# Patient Record
Sex: Female | Born: 1990 | Race: Black or African American | Hispanic: No | Marital: Single | Smoking: Current every day smoker
Health system: Southern US, Community
[De-identification: ages and names within clinical notes are randomized; demographics above are authoritative.]

## PROBLEM LIST (undated history)

## (undated) ENCOUNTER — Emergency Department (HOSPITAL_COMMUNITY): Admission: EM | Payer: Medicaid Other | Source: Home / Self Care

---
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
## 2011-08-26 ENCOUNTER — Emergency Department (HOSPITAL_COMMUNITY): Payer: Medicaid Other

## 2011-08-26 ENCOUNTER — Encounter (HOSPITAL_COMMUNITY): Payer: Self-pay | Admitting: Emergency Medicine

## 2011-08-26 ENCOUNTER — Emergency Department (HOSPITAL_COMMUNITY)
Admission: EM | Admit: 2011-08-26 | Discharge: 2011-08-26 | Disposition: A | Discharge status: Home / Self Care | Payer: Medicaid Other | Attending: Emergency Medicine | Admitting: Emergency Medicine

## 2011-08-26 DIAGNOSIS — B9689 Other specified bacterial agents as the cause of diseases classified elsewhere: Secondary | ICD-10-CM | POA: Insufficient documentation

## 2011-08-26 DIAGNOSIS — A499 Bacterial infection, unspecified: Secondary | ICD-10-CM | POA: Insufficient documentation

## 2011-08-26 DIAGNOSIS — N760 Acute vaginitis: Secondary | ICD-10-CM | POA: Insufficient documentation

## 2011-08-26 DIAGNOSIS — N72 Inflammatory disease of cervix uteri: Secondary | ICD-10-CM | POA: Insufficient documentation

## 2011-08-26 DIAGNOSIS — R109 Unspecified abdominal pain: Secondary | ICD-10-CM | POA: Insufficient documentation

## 2011-08-26 LAB — PREGNANCY, URINE: Preg Test, Ur: NEGATIVE

## 2011-08-26 LAB — URINALYSIS, ROUTINE W REFLEX MICROSCOPIC
Nitrite: NEGATIVE
Specific Gravity, Urine: 1.03 (ref 1.005–1.030)
Urobilinogen, UA: 1 mg/dL (ref 0.0–1.0)

## 2011-08-26 LAB — WET PREP, GENITAL
Trich, Wet Prep: NONE SEEN
Yeast Wet Prep HPF POC: NONE SEEN

## 2011-08-26 LAB — URINE MICROSCOPIC-ADD ON

## 2011-08-26 MED ORDER — METRONIDAZOLE 500 MG PO TABS: 500.0000 mg | ORAL_TABLET | Freq: Two times a day (BID) | ORAL | Status: AC

## 2011-08-26 MED ORDER — LIDOCAINE HCL (PF) 1 % IJ SOLN
INTRAMUSCULAR | Status: AC
  Administered 2011-08-26: 2 mL
  Filled 2011-08-26: qty 5

## 2011-08-26 MED ORDER — AZITHROMYCIN 250 MG PO TABS
1000.0000 mg | ORAL_TABLET | Freq: Once | ORAL | Status: AC
  Administered 2011-08-26: 1000 mg via ORAL
  Filled 2011-08-26: qty 4

## 2011-08-26 MED ORDER — KETOROLAC TROMETHAMINE 60 MG/2ML IM SOLN
60.0000 mg | Freq: Once | INTRAMUSCULAR | Status: AC
  Administered 2011-08-26: 60 mg via INTRAMUSCULAR
  Filled 2011-08-26: qty 2

## 2011-08-26 MED ORDER — CEFTRIAXONE SODIUM 250 MG IJ SOLR
250.0000 mg | Freq: Once | INTRAMUSCULAR | Status: AC
  Administered 2011-08-26: 250 mg via INTRAMUSCULAR
  Filled 2011-08-26: qty 250

## 2011-08-26 MED ORDER — ONDANSETRON HCL 4 MG PO TABS: 4.0000 mg | ORAL_TABLET | Freq: Four times a day (QID) | ORAL | Status: AC

## 2011-08-26 NOTE — ED Provider Notes (Signed)
Medical screening examination/treatment/procedure(s) were performed by non-physician practitioner and as supervising physician I was immediately available for consultation/collaboration.  Sunnie Nielsen, MD 08/26/11 [phone]

## 2011-08-26 NOTE — ED Notes (Signed)
Patient transported to Ultrasound 

## 2011-08-26 NOTE — ED Notes (Signed)
Patient with abdominal pain/cramping for last two hours.  Patient has been nauseated, no vomiting.  Patient states she is on her menses, pain has never been this bad.

## 2011-08-26 NOTE — ED Provider Notes (Signed)
History     CSN: [id number]  Arrival date & time 08/26/11  1308   First MD Initiated Contact with Patient 08/26/11 0602      Chief Complaint  Patient presents with  . Abdominal Pain    (Consider location/radiation/quality/duration/timing/severity/associated sxs/prior treatment) HPI  21 year old female presents complaining of abdominal pain. Patient states for the past 2 days she has had sharp and achy sensation to her low abdomen associated with her menstruation. She has been on her menstruation for the same amount of time. States that this pain is very similar to the menstrual pain except much worse. The last time that she has similar pain was about a year ago. Patient also endorsed vaginal discharge for the past month. States it's not normal for her. Patient denies fever, chills, nausea, vomiting, diarrhea, dysuria, or rash. She has tried 2 ibuprofen today without relief. Patient states pain worsened with movement and improves when she lies still.  Denies pain with sexual intercourse and sts she has not engage in any sexual activities recently.  Pain worsen in the past 2 hrs.    History reviewed. No pertinent past medical history.  History reviewed. No pertinent past surgical history.  History reviewed. No pertinent family history.  History  Substance Use Topics  . Smoking status: Current Everyday Smoker    Types: Cigarettes  . Smokeless tobacco: Not on file  . Alcohol Use: No    OB History    Grav Para Term Preterm Abortions TAB SAB Ect Mult Living                  Review of Systems  All other systems reviewed and are negative.    Allergies  Review of patient's allergies indicates no known allergies.  Home Medications   Current Outpatient Rx  Name Route Sig Dispense Refill  . IBUPROFEN 200 MG PO TABS Oral Take 200 mg by mouth every 6 (six) hours as needed. For pain      BP 147/99  Temp 97.7 F (36.5 C) (Oral)  Resp 18  SpO2 100%  LMP  08/25/2011  Physical Exam  Nursing note and vitals reviewed. Constitutional: She appears well-developed and well-nourished. No distress.  HENT:  Head: Normocephalic and atraumatic.  Eyes: Conjunctivae are normal.  Neck: Normal range of motion. Neck supple.  Cardiovascular: Normal rate and regular rhythm.   Pulmonary/Chest: Effort normal and breath sounds normal. She exhibits no tenderness.  Abdominal: Soft. There is no tenderness.  Genitourinary: Uterus normal. There is no rash or lesion on the right labia. There is no rash or lesion on the left labia. Cervix exhibits no motion tenderness and no discharge. Right adnexum displays tenderness. Right adnexum displays no mass. Left adnexum displays no mass and no tenderness. There is bleeding around the vagina. No erythema or tenderness around the vagina. No vaginal discharge found.       Chaperone present  Lymphadenopathy:       Right: No inguinal adenopathy present.       Left: No inguinal adenopathy present.    ED Course  Procedures (including critical care time)  Labs Reviewed - No data to display No results found.   No diagnosis found.  Results for orders placed during the hospital encounter of 08/26/11  URINALYSIS, ROUTINE W REFLEX MICROSCOPIC      Component Value Range   Color, Urine RED (*) YELLOW   APPearance TURBID (*) CLEAR   Specific Gravity, Urine 1.030  1.005 - 1.030   pH  5.0  5.0 - 8.0   Glucose, UA NEGATIVE  NEGATIVE mg/dL   Hgb urine dipstick LARGE (*) NEGATIVE   Bilirubin Urine LARGE (*) NEGATIVE   Ketones, ur 15 (*) NEGATIVE mg/dL   Protein, ur 956 (*) NEGATIVE mg/dL   Urobilinogen, UA 1.0  0.0 - 1.0 mg/dL   Nitrite NEGATIVE  NEGATIVE   Leukocytes, UA MODERATE (*) NEGATIVE  PREGNANCY, URINE      Component Value Range   Preg Test, Ur NEGATIVE  NEGATIVE  WET PREP, GENITAL      Component Value Range   Yeast Wet Prep HPF POC NONE SEEN  NONE SEEN   Trich, Wet Prep NONE SEEN  NONE SEEN   Clue Cells Wet Prep  HPF POC ABUNDANT (*) NONE SEEN   WBC, Wet Prep HPF POC MANY (*) NONE SEEN  URINE MICROSCOPIC-ADD ON      Component Value Range   Squamous Epithelial / LPF RARE  RARE   WBC, UA 7-10  <3 WBC/hpf   RBC / HPF TOO NUMEROUS TO COUNT  <3 RBC/hpf   Bacteria, UA RARE  RARE   Urine-Other MUCOUS PRESENT     US Transvaginal Non-ob  08/26/2011  *RADIOLOGY REPORT*  Clinical Data: 21 year old with adnexal pain.  TRANSABDOMINAL AND TRANSVAGINAL ULTRASOUND OF PELVIS Technique:  Both transabdominal and transvaginal ultrasound examinations of the pelvis were performed. Transabdominal technique was performed for global imaging of the pelvis including uterus, ovaries, adnexal regions, and pelvic cul-de-sac.  It was necessary to proceed with endovaginal exam following the transabdominal exam to visualize the uterus and ovarian tissue.  Comparison:  None  Findings:  Uterus: Normal anteverted position.  Normal appearance of the uterus and it measures 6.3 x 3.4 x 3.7 cm.  Endometrium: Normal appearance and measures 0.3 cm.  Right ovary:  Normal appearance and measures 2.2 x 1.0 x 1.0 cm. Small follicles are present.  Left ovary: Normal appearance and measures 2.7 x 0.9 x 1.1 cm. There is at least one small follicle.  Other findings: Small amount of free fluid in the cul-de-sac.  IMPRESSION: Normal study. No evidence of pelvic mass or other significant abnormality.  Original Report Authenticated By: Richarda Overlie, M.D.   US Pelvis Complete  08/26/2011  *RADIOLOGY REPORT*  Clinical Data: 21 year old with adnexal pain.  TRANSABDOMINAL AND TRANSVAGINAL ULTRASOUND OF PELVIS Technique:  Both transabdominal and transvaginal ultrasound examinations of the pelvis were performed. Transabdominal technique was performed for global imaging of the pelvis including uterus, ovaries, adnexal regions, and pelvic cul-de-sac.  It was necessary to proceed with endovaginal exam following the transabdominal exam to visualize the uterus and ovarian  tissue.  Comparison:  None  Findings:  Uterus: Normal anteverted position.  Normal appearance of the uterus and it measures 6.3 x 3.4 x 3.7 cm.  Endometrium: Normal appearance and measures 0.3 cm.  Right ovary:  Normal appearance and measures 2.2 x 1.0 x 1.0 cm. Small follicles are present.  Left ovary: Normal appearance and measures 2.7 x 0.9 x 1.1 cm. There is at least one small follicle.  Other findings: Small amount of free fluid in the cul-de-sac.  IMPRESSION: Normal study. No evidence of pelvic mass or other significant abnormality.  Original Report Authenticated By: Richarda Overlie, M.D.      1.bacterial vaginosis 2. cervicitis  MDM  Low abd pain relating to menstrual cramp.  Nonsurgical abdomen.  Mild tenderness to right adnexa without mass. No CMT.   Patient is currently on her menstruation. No McBurney's  point. Low suspicion for appendicitis.  8:09 AM Wet Prep shows abundant clue cells.  Will treat for BV.  It does shows many WBC.  Will obtain pelvic US to r/o tuboovarian abscess, torsion, ovarian cyst, or uterine fibroid.    10:38 AM Pt felt much better. Pt does have R adnexal tenderness but pelvic US is unremarkable. Rocephin/zithromax given.  Cultures sent.      Fayrene Helper, PA-C 08/26/11 1038

## 2011-08-26 NOTE — ED Notes (Signed)
Pt stated that she started her menstrual cycle on yesterday. This AM she started having severe lower abdominal pain. No nausea or vomiting. Pt stated that she is not sexually active. No abnormal vaginal discharge. No odors. Will continue to monitor.

## 2011-08-26 NOTE — Discharge Instructions (Signed)
Bacterial Vaginosis Bacterial vaginosis (BV) is a vaginal infection where the normal balance of bacteria in the vagina is disrupted. The normal balance is then replaced by an overgrowth of certain bacteria. There are several different kinds of bacteria that can cause BV. BV is the most common vaginal infection in women of childbearing age. CAUSES   The cause of BV is not fully understood. BV develops when there is an increase or imbalance of harmful bacteria.   Some activities or behaviors can upset the normal balance of bacteria in the vagina and put women at increased risk including:   Having a new sex partner or multiple sex partners.   Douching.   Using an intrauterine device (IUD) for contraception.   It is not clear what role sexual activity plays in the development of BV. However, women that have never had sexual intercourse are rarely infected with BV.  Women do not get BV from toilet seats, bedding, swimming pools or from touching objects around them.  SYMPTOMS   Grey vaginal discharge.   A fish-like odor with discharge, especially after sexual intercourse.   Itching or burning of the vagina and vulva.   Burning or pain with urination.   Some women have no signs or symptoms at all.  DIAGNOSIS  Your caregiver must examine the vagina for signs of BV. Your caregiver will perform lab tests and look at the sample of vaginal fluid through a microscope. They will look for bacteria and abnormal cells (clue cells), a pH test higher than 4.5, and a positive amine test all associated with BV.  RISKS AND COMPLICATIONS   Pelvic inflammatory disease (PID).   Infections following gynecology surgery.   Developing HIV.   Developing herpes virus.  TREATMENT  Sometimes BV will clear up without treatment. However, all women with symptoms of BV should be treated to avoid complications, especially if gynecology surgery is planned. Female partners generally do not need to be treated. However,  BV may spread between female sex partners so treatment is helpful in preventing a recurrence of BV.   BV may be treated with antibiotics. The antibiotics come in either pill or vaginal cream forms. Either can be used with nonpregnant or pregnant women, but the recommended dosages differ. These antibiotics are not harmful to the baby.   BV can recur after treatment. If this happens, a second round of antibiotics will often be prescribed.   Treatment is important for pregnant women. If not treated, BV can cause a premature delivery, especially for a pregnant woman who had a premature birth in the past. All pregnant women who have symptoms of BV should be checked and treated.   For chronic reoccurrence of BV, treatment with a type of prescribed gel vaginally twice a week is helpful.  HOME CARE INSTRUCTIONS   Finish all medication as directed by your caregiver.   Do not have sex until treatment is completed.   Tell your sexual partner that you have a vaginal infection. They should see their caregiver and be treated if they have problems, such as a mild rash or itching.   Practice safe sex. Use condoms. Only have 1 sex partner.  PREVENTION  Basic prevention steps can help reduce the risk of upsetting the natural balance of bacteria in the vagina and developing BV:  Do not have sexual intercourse (be abstinent).   Do not douche.   Use all of the medicine prescribed for treatment of BV, even if the signs and symptoms go away.     Tell your sex partner if you have BV. That way, they can be treated, if needed, to prevent reoccurrence.  SEEK MEDICAL CARE IF:   Your symptoms are not improving after 3 days of treatment.   You have increased discharge, pain, or fever.  MAKE SURE YOU:   Understand these instructions.   Will watch your condition.   Will get help right away if you are not doing well or get worse.  FOR MORE INFORMATION  Division of STD Prevention (DSTDP), Centers for Disease  Control and Prevention: [employer] American Social Health Association (ASHA): www.ashastd.org  Document Released: 02/16/2005 Document Revised: 02/05/2011 Document Reviewed: 08/09/2008 [facility] Patient Information 2012 [place], [place].Cervicitis Cervicitis is a soreness and swelling (inflammation) of the cervix. Your cervix is located at the bottom of your uterus which opens up to the vagina.  CAUSES   Sexually transmitted infections (STIs).   Allergic reaction.   Medicines or birth control devices that are put in the vagina.   Injury to the cervix.   Bacterial infections.  SYMPTOMS  There may be no symptoms. If symptoms occur, they may include:  Grey, white, yellow, or bad smelling vaginal discharge.   Pain or itching of the area outside the vagina.   Painful sexual intercourse.   Lower abdominal or lower back pain, especially during intercourse.   Frequent urination.   Abnormal vaginal bleeding between periods, after sexual intercourse, or after menopause.   Pressure or a heavy feeling in the pelvis.  DIAGNOSIS  Diagnosis is made after a pelvic exam. Other tests may include:  Examination of any discharge under a microscope (wet prep).   A Pap test.  TREATMENT  Treatment will depend on the cause of cervicitis. If it is caused by an STI, both you and your partner will need to be treated. Antibiotic medicines will be given. HOME CARE INSTRUCTIONS   Do not have sexual intercourse until your caregiver says it is okay.   Do not have sexual intercourse until your partner has been treated if your cervicitis is caused by an STI.   Take your antibiotics as directed. Finish them even if you start to feel better.  SEEK IMMEDIATE MEDICAL CARE IF:   Your symptoms come back.   You have a fever.   You experience any problems that may be related to the medicine you are taking.  MAKE SURE YOU:   Understand these instructions.   Will watch your condition.   Will get  help right away if you are not doing well or get worse.  Document Released: 02/16/2005 Document Revised: 02/05/2011 Document Reviewed: 09/15/2010 [facility] Patient Information 2012 [place], [place].

## 2011-08-26 NOTE — ED Notes (Signed)
Gave report to Lori RN

## 2011-08-27 LAB — GC/CHLAMYDIA PROBE AMP, GENITAL
Chlamydia, DNA Probe: POSITIVE — AB
GC Probe Amp, Genital: NEGATIVE

## 2011-08-28 LAB — URINE CULTURE
Colony Count: >=100000
Culture  Setup Time: 201306260939

## 2011-08-28 NOTE — ED Notes (Signed)
+  Chlamydia. Patient treated with Rocephin and Zithromax. [employer] faxed. 

## 2011-08-29 NOTE — ED Notes (Signed)
Patient called back and was informed of +result. Advised to abstain from sex for 2 weeks and notify partner(s) to get treated.

## 2011-08-29 NOTE — ED Notes (Signed)
Attempted to contact patient. No answer. Left voicemail for patient to return call. 

## 2011-09-22 ENCOUNTER — Encounter (HOSPITAL_COMMUNITY): Payer: Self-pay | Admitting: *Deleted

## 2011-09-22 ENCOUNTER — Emergency Department (HOSPITAL_COMMUNITY)
Admission: EM | Admit: 2011-09-22 | Discharge: 2011-09-22 | Disposition: A | Discharge status: Home / Self Care | Payer: Medicaid Other | Attending: Emergency Medicine | Admitting: Emergency Medicine

## 2011-09-22 DIAGNOSIS — N949 Unspecified condition associated with female genital organs and menstrual cycle: Secondary | ICD-10-CM | POA: Insufficient documentation

## 2011-09-22 DIAGNOSIS — R102 Pelvic and perineal pain: Secondary | ICD-10-CM

## 2011-09-22 DIAGNOSIS — N926 Irregular menstruation, unspecified: Secondary | ICD-10-CM | POA: Insufficient documentation

## 2011-09-22 DIAGNOSIS — R110 Nausea: Secondary | ICD-10-CM | POA: Insufficient documentation

## 2011-09-22 LAB — CBC WITH DIFFERENTIAL/PLATELET
Basophils Absolute: 0 10*3/uL (ref 0.0–0.1)
Basophils Relative: 0 % (ref 0–1)
Eosinophils Absolute: 0.2 K/uL (ref 0.0–0.7)
Eosinophils Relative: 3 % (ref 0–5)
HCT: 44.3 % (ref 36.0–46.0)
Hemoglobin: 15.1 g/dL — ABNORMAL HIGH (ref 12.0–15.0)
Lymphocytes Relative: 28 % (ref 12–46)
Lymphs Abs: 2.2 K/uL (ref 0.7–4.0)
MCH: 31.2 pg (ref 26.0–34.0)
MCHC: 34.1 g/dL (ref 30.0–36.0)
MCV: 91.5 fL (ref 78.0–100.0)
Monocytes Absolute: 0.4 10*3/uL (ref 0.1–1.0)
Monocytes Relative: 5 % (ref 3–12)
Neutro Abs: 5.1 10*3/uL (ref 1.7–7.7)
Neutrophils Relative %: 64 % (ref 43–77)
Platelets: 208 K/uL (ref 150–400)
RBC: 4.84 MIL/uL (ref 3.87–5.11)
RDW: 13.2 % (ref 11.5–15.5)
WBC: 7.9 10*3/uL (ref 4.0–10.5)

## 2011-09-22 LAB — URINALYSIS, ROUTINE W REFLEX MICROSCOPIC
Bilirubin Urine: NEGATIVE
Glucose, UA: NEGATIVE mg/dL
Ketones, ur: NEGATIVE mg/dL
Leukocytes, UA: NEGATIVE
Nitrite: NEGATIVE
Protein, ur: NEGATIVE mg/dL
Specific Gravity, Urine: 1.013 (ref 1.005–1.030)
Urobilinogen, UA: 1 mg/dL (ref 0.0–1.0)
pH: 6 (ref 5.0–8.0)

## 2011-09-22 LAB — URINE MICROSCOPIC-ADD ON

## 2011-09-22 LAB — POCT I-STAT, CHEM 8
BUN: 9 mg/dL (ref 6–23)
Calcium, Ion: 1.24 mmol/L — ABNORMAL HIGH (ref 1.12–1.23)
Chloride: 107 mEq/L (ref 96–112)
Creatinine, Ser: 0.9 mg/dL (ref 0.50–1.10)
Glucose, Bld: 101 mg/dL — ABNORMAL HIGH (ref 70–99)
HCT: 47 % — ABNORMAL HIGH (ref 36.0–46.0)
Potassium: 4 mEq/L (ref 3.5–5.1)

## 2011-09-22 LAB — WET PREP, GENITAL
Trich, Wet Prep: NONE SEEN
Yeast Wet Prep HPF POC: NONE SEEN

## 2011-09-22 MED ORDER — HYDROCODONE-ACETAMINOPHEN 5-325 MG PO TABS: 1.0000 | ORAL_TABLET | ORAL | Status: AC | PRN

## 2011-09-22 NOTE — ED Notes (Signed)
Pt is here with vaginal discharge that is white with fish odor.  LMP- finished 3 days ago.  Pt has lower abd pain.  Pt has burning with urination

## 2011-09-22 NOTE — ED Provider Notes (Signed)
History     CSN: [id number]  Arrival date & time 09/22/11  1042   First MD Initiated Contact with Patient 09/22/11 [phone]      Chief Complaint  Patient presents with  . Abdominal Pain    (Consider location/radiation/quality/duration/timing/severity/associated sxs/prior treatment) Patient is a 21 y.o. female presenting with abdominal pain. The history is provided by the patient.  Abdominal Pain The primary symptoms of the illness include abdominal pain, nausea and vaginal discharge. The primary symptoms of the illness do not include fever, shortness of breath, vomiting or dysuria. The current episode started more than 2 days ago.  The vaginal discharge is not associated with dysuria.   Associated with: She continues to have lower abdominal pain, vaginal discharge x 2 weeks, and irregular menses x several months. Associated symptoms comments: She was seen and evaluated on 08-26-11 for same symptoms. .    History reviewed. No pertinent past medical history.  History reviewed. No pertinent past surgical history.  No family history on file.  History  Substance Use Topics  . Smoking status: Current Everyday Smoker    Types: Cigarettes  . Smokeless tobacco: Not on file  . Alcohol Use: No    OB History    Grav Para Term Preterm Abortions TAB SAB Ect Mult Living                  Review of Systems  Constitutional: Negative for fever.  Respiratory: Negative for shortness of breath.   Cardiovascular: Negative for chest pain.  Gastrointestinal: Positive for nausea and abdominal pain. Negative for vomiting.  Genitourinary: Positive for vaginal discharge and menstrual problem. Negative for dysuria.    Allergies  Review of patient's allergies indicates no known allergies.  Home Medications  No current outpatient prescriptions on file.  BP 103/67  Pulse 53  Temp 98.1 F (36.7 C) (Oral)  Resp 20  SpO2 99%  LMP 09/18/2011  Physical Exam  Constitutional: She appears  well-developed and well-nourished.  HENT:  Head: Normocephalic.  Neck: Neck supple.  Pulmonary/Chest: Effort normal.  Abdominal: Soft. Bowel sounds are normal. There is no tenderness. There is no rebound and no guarding.  Genitourinary:       Mild pelvic tenderness on bimanual exam. No mass. Cervix appears unremarkable without discharge on exam.   Musculoskeletal: Normal range of motion.  Neurological: She is alert.  Skin: Skin is warm and dry.  Psychiatric: She has a normal mood and affect.    ED Course  Procedures (including critical care time)  Labs Reviewed  URINALYSIS, ROUTINE W REFLEX MICROSCOPIC - Abnormal; Notable for the following:    APPearance CLOUDY (*)     Hgb urine dipstick SMALL (*)     All other components within normal limits  CBC WITH DIFFERENTIAL - Abnormal; Notable for the following:    Hemoglobin 15.1 (*)     All other components within normal limits  POCT I-STAT, CHEM 8 - Abnormal; Notable for the following:    Glucose, Bld 101 (*)     Calcium, Ion 1.24 (*)     Hemoglobin 16.0 (*)     HCT 47.0 (*)     All other components within normal limits  URINE MICROSCOPIC-ADD ON - Abnormal; Notable for the following:    Squamous Epithelial / LPF MANY (*)     Bacteria, UA FEW (*)     All other components within normal limits  WET PREP, GENITAL - Abnormal; Notable for the following:    Clue  Cells Wet Prep HPF POC FEW (*)     WBC, Wet Prep HPF POC MODERATE (*)     All other components within normal limits  POCT PREGNANCY, URINE  GC/CHLAMYDIA PROBE AMP, GENITAL   No results found.  Results for orders placed during the hospital encounter of 09/22/11  URINALYSIS, ROUTINE W REFLEX MICROSCOPIC      Component Value Range   Color, Urine YELLOW  YELLOW   APPearance CLOUDY (*) CLEAR   Specific Gravity, Urine 1.013  1.005 - 1.030   pH 6.0  5.0 - 8.0   Glucose, UA NEGATIVE  NEGATIVE mg/dL   Hgb urine dipstick SMALL (*) NEGATIVE   Bilirubin Urine NEGATIVE  NEGATIVE    Ketones, ur NEGATIVE  NEGATIVE mg/dL   Protein, ur NEGATIVE  NEGATIVE mg/dL   Urobilinogen, UA 1.0  0.0 - 1.0 mg/dL   Nitrite NEGATIVE  NEGATIVE   Leukocytes, UA NEGATIVE  NEGATIVE  CBC WITH DIFFERENTIAL      Component Value Range   WBC 7.9  4.0 - 10.5 K/uL   RBC 4.84  3.87 - 5.11 MIL/uL   Hemoglobin 15.1 (*) 12.0 - 15.0 g/dL   HCT 16.1  09.6 - 04.5 %   MCV 91.5  78.0 - 100.0 fL   MCH 31.2  26.0 - 34.0 pg   MCHC 34.1  30.0 - 36.0 g/dL   RDW 40.9  81.1 - 91.4 %   Platelets 208  150 - 400 K/uL   Neutrophils Relative 64  43 - 77 %   Neutro Abs 5.1  1.7 - 7.7 K/uL   Lymphocytes Relative 28  12 - 46 %   Lymphs Abs 2.2  0.7 - 4.0 K/uL   Monocytes Relative 5  3 - 12 %   Monocytes Absolute 0.4  0.1 - 1.0 K/uL   Eosinophils Relative 3  0 - 5 %   Eosinophils Absolute 0.2  0.0 - 0.7 K/uL   Basophils Relative 0  0 - 1 %   Basophils Absolute 0.0  0.0 - 0.1 K/uL  POCT PREGNANCY, URINE      Component Value Range   Preg Test, Ur NEGATIVE  NEGATIVE  POCT I-STAT, CHEM 8      Component Value Range   Sodium 140  135 - 145 mEq/L   Potassium 4.0  3.5 - 5.1 mEq/L   Chloride 107  96 - 112 mEq/L   BUN 9  6 - 23 mg/dL   Creatinine, Ser 7.82  0.50 - 1.10 mg/dL   Glucose, Bld 956 (*) 70 - 99 mg/dL   Calcium, Ion 2.13 (*) 1.12 - 1.23 mmol/L   TCO2 25  0 - 100 mmol/L   Hemoglobin 16.0 (*) 12.0 - 15.0 g/dL   HCT 08.6 (*) 57.8 - 46.9 %  URINE MICROSCOPIC-ADD ON      Component Value Range   Squamous Epithelial / LPF MANY (*) RARE   WBC, UA 0-2  <3 WBC/hpf   RBC / HPF 0-2  <3 RBC/hpf   Bacteria, UA FEW (*) RARE  WET PREP, GENITAL      Component Value Range   Yeast Wet Prep HPF POC NONE SEEN  NONE SEEN   Trich, Wet Prep NONE SEEN  NONE SEEN   Clue Cells Wet Prep HPF POC FEW (*) NONE SEEN   WBC, Wet Prep HPF POC MODERATE (*) NONE SEEN    No diagnosis found. 1. Irregular menses 2. Pelvic pain    MDM  She  is having persistent symptoms for weeks. Records reviewed - pelvic US and GC/Chlamydia  cultures from 6/26 negative. Will refer to [facility] and treat pain.        Rodena Medin, PA-C 09/22/11 1648

## 2011-09-23 NOTE — ED Provider Notes (Signed)
Medical screening examination/treatment/procedure(s) were performed by non-physician practitioner and as supervising physician I was immediately available for consultation/collaboration.   Morgan Morales. Oletta Lamas, MD 09/23/11 [phone]

## 2012-05-25 ENCOUNTER — Encounter (HOSPITAL_COMMUNITY): Payer: Self-pay

## 2012-05-25 ENCOUNTER — Other Ambulatory Visit (HOSPITAL_COMMUNITY)
Admission: RE | Admit: 2012-05-25 | Discharge: 2012-05-25 | Disposition: A | Discharge status: Home / Self Care | Payer: Medicaid Other | Source: Ambulatory Visit | Attending: Family Medicine | Admitting: Family Medicine

## 2012-05-25 ENCOUNTER — Emergency Department (INDEPENDENT_AMBULATORY_CARE_PROVIDER_SITE_OTHER)
Admission: EM | Admit: 2012-05-25 | Discharge: 2012-05-25 | Disposition: A | Discharge status: Home / Self Care | Payer: Medicaid Other | Source: Home / Self Care | Attending: Family Medicine | Admitting: Family Medicine

## 2012-05-25 DIAGNOSIS — N760 Acute vaginitis: Secondary | ICD-10-CM | POA: Insufficient documentation

## 2012-05-25 DIAGNOSIS — Z113 Encounter for screening for infections with a predominantly sexual mode of transmission: Secondary | ICD-10-CM | POA: Insufficient documentation

## 2012-05-25 DIAGNOSIS — N72 Inflammatory disease of cervix uteri: Secondary | ICD-10-CM

## 2012-05-25 DIAGNOSIS — N898 Other specified noninflammatory disorders of vagina: Secondary | ICD-10-CM

## 2012-05-25 LAB — POCT URINALYSIS DIP (DEVICE)
Glucose, UA: NEGATIVE mg/dL
Nitrite: NEGATIVE
Protein, ur: NEGATIVE mg/dL
Specific Gravity, Urine: 1.025 (ref 1.005–1.030)
Urobilinogen, UA: 0.2 mg/dL (ref 0.0–1.0)
pH: 6 (ref 5.0–8.0)

## 2012-05-25 MED ORDER — AZITHROMYCIN 250 MG PO TABS
1000.0000 mg | ORAL_TABLET | Freq: Once | ORAL | Status: AC
  Administered 2012-05-25: 1000 mg via ORAL

## 2012-05-25 MED ORDER — CEFTRIAXONE SODIUM 250 MG IJ SOLR
INTRAMUSCULAR | Status: AC
  Filled 2012-05-25: qty 250

## 2012-05-25 MED ORDER — LIDOCAINE HCL (PF) 1 % IJ SOLN
INTRAMUSCULAR | Status: AC
  Filled 2012-05-25: qty 5

## 2012-05-25 MED ORDER — METRONIDAZOLE 500 MG PO TABS: 500.0000 mg | ORAL_TABLET | Freq: Two times a day (BID) | ORAL | Status: DC

## 2012-05-25 MED ORDER — NAPROXEN 500 MG PO TABS: 500.0000 mg | ORAL_TABLET | Freq: Two times a day (BID) | ORAL | Status: DC

## 2012-05-25 MED ORDER — CEFTRIAXONE SODIUM 250 MG IJ SOLR
250.0000 mg | Freq: Once | INTRAMUSCULAR | Status: AC
  Administered 2012-05-25: 250 mg via INTRAMUSCULAR

## 2012-05-25 MED ORDER — AZITHROMYCIN 250 MG PO TABS
ORAL_TABLET | ORAL | Status: AC
  Filled 2012-05-25: qty 4

## 2012-05-25 NOTE — ED Provider Notes (Signed)
History     CSN: [id number]  Arrival date & time 05/25/12  1008   First MD Initiated Contact with Patient 05/25/12 1025      Chief Complaint  Patient presents with  . Vaginal Discharge    (Consider location/radiation/quality/duration/timing/severity/associated sxs/prior treatment) HPI Comments: 23 year old smoker female G0 P0 with a history of gonorrhea 2 years ago adequately treated as per patient. Here complaining of vaginal discharge for 2 weeks. Reports thick discharge with bad smell. Patient also reports she's been spotting in the last 2-3 days. She has not had full menstrual periods since she got Implanon few months ago. Reports low mentrual cramping. No fever or chills no nausea or vomiting. Her pregnancy test is negative today. Reports unprotected sex with a steady partner.   History reviewed. No pertinent past medical history.  History reviewed. No pertinent past surgical history.  No family history on file.  History  Substance Use Topics  . Smoking status: Current Every Day Smoker    Types: Cigarettes  . Smokeless tobacco: Not on file  . Alcohol Use: No    OB History   Grav Para Term Preterm Abortions TAB SAB Ect Mult Living                  Review of Systems  Constitutional: Negative for fever, chills and appetite change.  Gastrointestinal: Negative for nausea, vomiting and diarrhea.  Genitourinary: Positive for vaginal discharge and menstrual problem. Negative for dysuria, frequency and flank pain.  Skin: Negative for rash.  Neurological: Negative for dizziness and headaches.    Allergies  Review of patient's allergies indicates no known allergies.  Home Medications   Current Outpatient Rx  Name  Route  Sig  Dispense  Refill  . metroNIDAZOLE (FLAGYL) 500 MG tablet   Oral   Take 1 tablet (500 mg total) by mouth 2 (two) times daily.   14 tablet   0   . naproxen (NAPROSYN) 500 MG tablet   Oral   Take 1 tablet (500 mg total) by mouth 2 (two) times  daily with a meal.   30 tablet   0     BP 129/85  Pulse 77  Temp(Src) 97 F (36.1 C)  Resp 12  SpO2 98%  Physical Exam  Nursing note and vitals reviewed. Constitutional: She is oriented to person, place, and time. She appears well-developed and well-nourished. No distress.  HENT:  Head: Normocephalic and atraumatic.  Mouth/Throat: No oropharyngeal exudate.  Eyes: Conjunctivae are normal. No scleral icterus.  Cardiovascular: Normal heart sounds.   Pulmonary/Chest: Breath sounds normal.  Abdominal: Soft. Bowel sounds are normal. She exhibits no distension and no mass. There is no tenderness. There is no rebound and no guarding. Hernia confirmed negative in the right inguinal area and confirmed negative in the left inguinal area.  Genitourinary: Uterus normal. There is no rash on the right labia. There is no rash on the left labia. Cervix exhibits discharge and friability. Cervix exhibits no motion tenderness. Right adnexum displays no mass, no tenderness and no fullness. Left adnexum displays no mass, no tenderness and no fullness. There is bleeding around the vagina. Vaginal discharge found.  Lymphadenopathy:    She has no cervical adenopathy.       Right: No inguinal adenopathy present.       Left: No inguinal adenopathy present.  Neurological: She is alert and oriented to person, place, and time.  Skin: No rash noted. She is not diaphoretic.    ED Course  Procedures (including critical care time)  Labs Reviewed  POCT URINALYSIS DIP (DEVICE) - Abnormal; Notable for the following:    Hgb urine dipstick LARGE (*)    All other components within normal limits  POCT PREGNANCY, URINE  CERVICOVAGINAL ANCILLARY ONLY   No results found.   1. Cervicitis   2. Vaginal discharge       MDM  No history or findings suggestive of PID.treated empirically with Rocephin, azithromycin, naproxen and Flagyl.  Affirm, GC and Chlamydia pending at the time of discharge. Patient declined  HIV, RPR or any blood screening test. Supportive care and red flags that should prompt her return to medical attention discussed with patient and provided in writing.     Sharin Grave, MD 05/27/12 [phone]

## 2012-05-25 NOTE — ED Notes (Signed)
NAD at d/c ; call back number for lab issues verified at release

## 2012-05-25 NOTE — ED Notes (Addendum)
Concern for 2 week duration vaginal d/c; sexually active, Implanon for BC. States partner has not mentioned any STD concern; c/o low abdominal area pain , "7" on scale

## 2012-05-28 NOTE — ED Notes (Signed)
GC/Chlamydia neg., Affirm: Gardnerella pos., Candida and Trich neg.  Pt. adequately treated with Flagyl. Vassie Moselle 05/28/2012

## 2013-11-12 ENCOUNTER — Emergency Department (HOSPITAL_COMMUNITY): Payer: Medicaid Other

## 2013-11-12 ENCOUNTER — Emergency Department (HOSPITAL_COMMUNITY)
Admission: EM | Admit: 2013-11-12 | Discharge: 2013-11-12 | Disposition: A | Discharge status: Home / Self Care | Payer: Medicaid Other | Attending: Emergency Medicine | Admitting: Emergency Medicine

## 2013-11-12 ENCOUNTER — Encounter (HOSPITAL_COMMUNITY): Payer: Self-pay | Admitting: Emergency Medicine

## 2013-11-12 DIAGNOSIS — N644 Mastodynia: Secondary | ICD-10-CM | POA: Insufficient documentation

## 2013-11-12 DIAGNOSIS — F17200 Nicotine dependence, unspecified, uncomplicated: Secondary | ICD-10-CM | POA: Insufficient documentation

## 2013-11-12 DIAGNOSIS — R1030 Lower abdominal pain, unspecified: Secondary | ICD-10-CM

## 2013-11-12 DIAGNOSIS — A499 Bacterial infection, unspecified: Secondary | ICD-10-CM | POA: Insufficient documentation

## 2013-11-12 DIAGNOSIS — N760 Acute vaginitis: Secondary | ICD-10-CM | POA: Insufficient documentation

## 2013-11-12 DIAGNOSIS — B9689 Other specified bacterial agents as the cause of diseases classified elsewhere: Secondary | ICD-10-CM | POA: Insufficient documentation

## 2013-11-12 DIAGNOSIS — R109 Unspecified abdominal pain: Secondary | ICD-10-CM | POA: Insufficient documentation

## 2013-11-12 DIAGNOSIS — Z3202 Encounter for pregnancy test, result negative: Secondary | ICD-10-CM | POA: Insufficient documentation

## 2013-11-12 LAB — BASIC METABOLIC PANEL
Anion gap: 13 (ref 5–15)
BUN: 8 mg/dL (ref 6–23)
CHLORIDE: 103 meq/L (ref 96–112)
CO2: 23 mEq/L (ref 19–32)
Calcium: 9.1 mg/dL (ref 8.4–10.5)
Creatinine, Ser: 0.87 mg/dL (ref 0.50–1.10)
GFR calc non Af Amer: >90 mL/min (ref >90)
Glucose, Bld: 88 mg/dL (ref 70–99)
POTASSIUM: 4.5 meq/L (ref 3.7–5.3)
Sodium: 139 mEq/L (ref 137–147)

## 2013-11-12 LAB — URINALYSIS, ROUTINE W REFLEX MICROSCOPIC
Bilirubin Urine: NEGATIVE
Glucose, UA: NEGATIVE mg/dL
Ketones, ur: NEGATIVE mg/dL
LEUKOCYTES UA: NEGATIVE
NITRITE: NEGATIVE
PROTEIN: NEGATIVE mg/dL
Specific Gravity, Urine: 1.021 (ref 1.005–1.030)
UROBILINOGEN UA: 1 mg/dL (ref 0.0–1.0)
pH: 5.5 (ref 5.0–8.0)

## 2013-11-12 LAB — CBC
HEMATOCRIT: 43.2 % (ref 36.0–46.0)
HEMOGLOBIN: 14.8 g/dL (ref 12.0–15.0)
MCH: 31 pg (ref 26.0–34.0)
MCHC: 34.3 g/dL (ref 30.0–36.0)
MCV: 90.6 fL (ref 78.0–100.0)
Platelets: 193 10*3/uL (ref 150–400)
RBC: 4.77 MIL/uL (ref 3.87–5.11)
RDW: 13.1 % (ref 11.5–15.5)
WBC: 7.5 10*3/uL (ref 4.0–10.5)

## 2013-11-12 LAB — WET PREP, GENITAL
Trich, Wet Prep: NONE SEEN
Yeast Wet Prep HPF POC: NONE SEEN

## 2013-11-12 LAB — URINE MICROSCOPIC-ADD ON

## 2013-11-12 LAB — PREGNANCY, URINE: PREG TEST UR: NEGATIVE

## 2013-11-12 LAB — RPR

## 2013-11-12 MED ORDER — IOHEXOL 300 MG/ML  SOLN
100.0000 mL | Freq: Once | INTRAMUSCULAR | Status: AC | PRN
  Administered 2013-11-12: 100 mL via INTRAVENOUS

## 2013-11-12 MED ORDER — IOHEXOL 300 MG/ML  SOLN
25.0000 mL | Freq: Once | INTRAMUSCULAR | Status: AC | PRN
  Administered 2013-11-12: 25 mL via ORAL

## 2013-11-12 MED ORDER — ONDANSETRON HCL 4 MG/2ML IJ SOLN
4.0000 mg | Freq: Once | INTRAMUSCULAR | Status: AC
  Administered 2013-11-12: 4 mg via INTRAVENOUS
  Filled 2013-11-12: qty 2

## 2013-11-12 MED ORDER — MORPHINE SULFATE 4 MG/ML IJ SOLN
4.0000 mg | Freq: Once | INTRAMUSCULAR | Status: AC
  Administered 2013-11-12: 4 mg via INTRAVENOUS
  Filled 2013-11-12: qty 1

## 2013-11-12 MED ORDER — METRONIDAZOLE 500 MG PO TABS: 500.0000 mg | ORAL_TABLET | Freq: Two times a day (BID) | ORAL | Status: DC

## 2013-11-12 NOTE — ED Notes (Signed)
Pt from home for eval of lower abd pain x4 days with worsening yesterday, pt also reports brown vaginal discharge and fishy odor. States she has not had period in 2 years due to implant for [facility]. Reports same sexual partner as well. Denies any dysuria, hematuria, n/v/d. Nad noted. LBM 9/12/015

## 2013-11-12 NOTE — ED Notes (Signed)
CT informed that pt finished contrast. 

## 2013-11-12 NOTE — Discharge Instructions (Signed)
Follow up with your doctor for continued or worsening symptoms Abdominal Pain, Women Abdominal (stomach, pelvic, or belly) pain can be caused by many things. It is important to tell your doctor:  The location of the pain.  Does it come and go or is it present all the time?  Are there things that start the pain (eating certain foods, exercise)?  Are there other symptoms associated with the pain (fever, nausea, vomiting, diarrhea)? All of this is helpful to know when trying to find the cause of the pain. CAUSES   Stomach: virus or bacteria infection, or ulcer.  Intestine: appendicitis (inflamed appendix), regional ileitis (Crohn's disease), ulcerative colitis (inflamed colon), irritable bowel syndrome, diverticulitis (inflamed diverticulum of the colon), or cancer of the stomach or intestine.  Gallbladder disease or stones in the gallbladder.  Kidney disease, kidney stones, or infection.  Pancreas infection or cancer.  Fibromyalgia (pain disorder).  Diseases of the female organs:  Uterus: fibroid (non-cancerous) tumors or infection.  Fallopian tubes: infection or tubal pregnancy.  Ovary: cysts or tumors.  Pelvic adhesions (scar tissue).  Endometriosis (uterus lining tissue growing in the pelvis and on the pelvic organs).  Pelvic congestion syndrome (female organs filling up with blood just before the menstrual period).  Pain with the menstrual period.  Pain with ovulation (producing an egg).  Pain with an IUD (intrauterine device, birth control) in the uterus.  Cancer of the female organs.  Functional pain (pain not caused by a disease, may improve without treatment).  Psychological pain.  Depression. DIAGNOSIS  Your doctor will decide the seriousness of your pain by doing an examination.  Blood tests.  X-rays.  Ultrasound.  CT scan (computed tomography, special type of X-ray).  MRI (magnetic resonance imaging).  Cultures, for infection.  Barium enema  (dye inserted in the large intestine, to better view it with X-rays).  Colonoscopy (looking in intestine with a lighted tube).  Laparoscopy (minor surgery, looking in abdomen with a lighted tube).  Major abdominal exploratory surgery (looking in abdomen with a large incision). TREATMENT  The treatment will depend on the cause of the pain.   Many cases can be observed and treated at home.  Over-the-counter medicines recommended by your caregiver.  Prescription medicine.  Antibiotics, for infection.  Birth control pills, for painful periods or for ovulation pain.  Hormone treatment, for endometriosis.  Nerve blocking injections.  Physical therapy.  Antidepressants.  Counseling with a psychologist or psychiatrist.  Minor or major surgery. HOME CARE INSTRUCTIONS   Do not take laxatives, unless directed by your caregiver.  Take over-the-counter pain medicine only if ordered by your caregiver. Do not take aspirin because it can cause an upset stomach or bleeding.  Try a clear liquid diet (broth or water) as ordered by your caregiver. Slowly move to a bland diet, as tolerated, if the pain is related to the stomach or intestine.  Have a thermometer and take your temperature several times a day, and record it.  Bed rest and sleep, if it helps the pain.  Avoid sexual intercourse, if it causes pain.  Avoid stressful situations.  Keep your follow-up appointments and tests, as your caregiver orders.  If the pain does not go away with medicine or surgery, you may try:  Acupuncture.  Relaxation exercises (yoga, meditation).  Group therapy.  Counseling. SEEK MEDICAL CARE IF:   You notice certain foods cause stomach pain.  Your home care treatment is not helping your pain.  You need stronger pain medicine.  You want your IUD removed.  You feel faint or lightheaded.  You develop nausea and vomiting.  You develop a rash.  You are having side effects or an  allergy to your medicine. SEEK IMMEDIATE MEDICAL CARE IF:   Your pain does not go away or gets worse.  You have a fever.  Your pain is felt only in portions of the abdomen. The right side could possibly be appendicitis. The left lower portion of the abdomen could be colitis or diverticulitis.  You are passing blood in your stools (bright red or black tarry stools, with or without vomiting).  You have blood in your urine.  You develop chills, with or without a fever.  You pass out. MAKE SURE YOU:   Understand these instructions.  Will watch your condition.  Will get help right away if you are not doing well or get worse. Document Released: 12/14/2006 Document Revised: 07/03/2013 Document Reviewed: 01/03/2009 [facility] Patient Information 2015 [place], [place]. This information is not intended to replace advice given to you by your health care provider. Make sure you discuss any questions you have with your health care provider.

## 2013-11-12 NOTE — ED Provider Notes (Signed)
CSN: [id number]     Arrival date & time 11/12/13  1034 History   First MD Initiated Contact with Patient 11/12/13 1109     Chief Complaint  Patient presents with  . Abdominal Pain  . Breast Pain     (Consider location/radiation/quality/duration/timing/severity/associated sxs/prior Treatment) HPI Comments: Pt states that she is having lower abdominal pain for the last 4 days.states that she has the implanon. Has had some vaginal discharge. States that she is also spotting which she usually doesn't do. Denies fever, vomiting, diarrhea or dysuria.  The history is provided by the patient. No language interpreter was used.    History reviewed. No pertinent past medical history. History reviewed. No pertinent past surgical history. No family history on file. History  Substance Use Topics  . Smoking status: Current Every Day Smoker    Types: Cigarettes  . Smokeless tobacco: Not on file  . Alcohol Use: No   OB History   Grav Para Term Preterm Abortions TAB SAB Ect Mult Living                 Review of Systems  Constitutional: Negative.   Respiratory: Negative.   Cardiovascular: Negative.       Allergies  Review of patient's allergies indicates no known allergies.  Home Medications   Prior to Admission medications   Not on File   BP 131/80  Pulse 81  Temp(Src) 98.1 F (36.7 C)  Ht  (1.626 m)  Wt 213 lb 8 oz (96.843 kg)  BMI 36.63 kg/m2  SpO2 100% Physical Exam  Nursing note and vitals reviewed. Constitutional: She is oriented to person, place, and time. She appears well-developed and well-nourished.  HENT:  Head: Normocephalic and atraumatic.  Cardiovascular: Normal rate and regular rhythm.   Pulmonary/Chest: Effort normal and breath sounds normal.  Abdominal: Soft. Bowel sounds are normal. There is tenderness in the suprapubic area.  Genitourinary:  Small amount of blood in the vault  Musculoskeletal: Normal range of motion.  Neurological: She is alert  and oriented to person, place, and time.  Skin: Skin is warm and dry.  Psychiatric: She has a normal mood and affect.    ED Course  Procedures (including critical care time) Labs Review Labs Reviewed  WET PREP, GENITAL - Abnormal; Notable for the following:    Clue Cells Wet Prep HPF POC FEW (*)    WBC, Wet Prep HPF POC MODERATE (*)    All other components within normal limits  URINALYSIS, ROUTINE W REFLEX MICROSCOPIC - Abnormal; Notable for the following:    Color, Urine AMBER (*)    APPearance CLOUDY (*)    Hgb urine dipstick LARGE (*)    All other components within normal limits  GC/CHLAMYDIA PROBE AMP  PREGNANCY, URINE  CBC  BASIC METABOLIC PANEL  URINE MICROSCOPIC-ADD ON  RPR    Imaging Review Ct Abdomen Pelvis W Contrast  11/12/2013   CLINICAL DATA:  Lower abdominal pain for 4 days  EXAM: CT ABDOMEN AND PELVIS WITH CONTRAST  TECHNIQUE: Multidetector CT imaging of the abdomen and pelvis was performed using the standard protocol following bolus administration of intravenous contrast.  CONTRAST:  25mL OMNIPAQUE IOHEXOL 300 MG/ML SOLN, OMNIPAQUE IOHEXOL 300 MG/ML SOLN  COMPARISON:  None  FINDINGS: The lung bases are clear.  No pleural or pericardial effusion.  There is no focal liver abnormality. The gallbladder appears normal. No biliary dilatation. Normal appearance of the pancreas. The spleen is unremarkable.  The adrenal glands  are both normal. The kidneys are both unremarkable. The urinary bladder appears normal. The uterus and adnexal structures have a normal appearance.  Normal caliber of the abdominal aorta. No retroperitoneal adenopathy. No mesenteric adenopathy. There is no enlarged pelvic or inguinal adenopathy identified. No free fluid or fluid collections within the abdomen or the pelvis.  The stomach appears normal. The duodenum and jejunum have a normal appearance. The ileum and terminal ileum are normal. The appendix is visualized and appears normal. Normal  appearance of the proximal colon. The distal colon is unremarkable.  Review of the visualized osseous structures is unremarkable.  IMPRESSION: 1. No acute findings identified within the abdomen or pelvis.   Electronically Signed   By: Signa Kell M.D.   On: 11/12/2013 14:49     EKG Interpretation None      MDM   Final diagnoses:  BV (bacterial vaginosis)  Lower abdominal pain    Will treat for bv. No acute abdominal findings. Pt is not pregnant. Pt is okay to follow up with gyn for continued symptoms. Std cultures send    Teressa Lower, NP 11/12/13 1526

## 2013-11-12 NOTE — ED Notes (Signed)
Pt. Stated, I've been having lower abdominal pain and breast pain for the last 4 days . Pt. Sexually active and does not use protection.

## 2013-11-13 LAB — GC/CHLAMYDIA PROBE AMP
CT Probe RNA: NEGATIVE
GC Probe RNA: NEGATIVE

## 2013-11-15 NOTE — ED Provider Notes (Signed)
Medical screening examination/treatment/procedure(s) were performed by non-physician practitioner and as supervising physician I was immediately available for consultation/collaboration.   EKG Interpretation None        Tavious Griesinger, DO 11/15/13 0812 

## 2014-08-24 ENCOUNTER — Encounter (HOSPITAL_COMMUNITY): Payer: Self-pay | Admitting: Emergency Medicine

## 2014-08-24 ENCOUNTER — Emergency Department (HOSPITAL_COMMUNITY)
Admission: EM | Admit: 2014-08-24 | Discharge: 2014-08-24 | Disposition: A | Discharge status: Home / Self Care | Payer: Medicaid Other | Attending: Emergency Medicine | Admitting: Emergency Medicine

## 2014-08-24 DIAGNOSIS — B9689 Other specified bacterial agents as the cause of diseases classified elsewhere: Secondary | ICD-10-CM

## 2014-08-24 DIAGNOSIS — N760 Acute vaginitis: Secondary | ICD-10-CM

## 2014-08-24 DIAGNOSIS — Z3202 Encounter for pregnancy test, result negative: Secondary | ICD-10-CM | POA: Insufficient documentation

## 2014-08-24 DIAGNOSIS — Z720 Tobacco use: Secondary | ICD-10-CM | POA: Insufficient documentation

## 2014-08-24 DIAGNOSIS — R51 Headache: Secondary | ICD-10-CM | POA: Insufficient documentation

## 2014-08-24 DIAGNOSIS — N72 Inflammatory disease of cervix uteri: Secondary | ICD-10-CM

## 2014-08-24 LAB — URINALYSIS, ROUTINE W REFLEX MICROSCOPIC
BILIRUBIN URINE: NEGATIVE
Glucose, UA: NEGATIVE mg/dL
KETONES UR: NEGATIVE mg/dL
Nitrite: NEGATIVE
PH: 5.5 (ref 5.0–8.0)
PROTEIN: NEGATIVE mg/dL
SPECIFIC GRAVITY, URINE: 1.015 (ref 1.005–1.030)
UROBILINOGEN UA: 0.2 mg/dL (ref 0.0–1.0)

## 2014-08-24 LAB — COMPREHENSIVE METABOLIC PANEL
ALK PHOS: 90 U/L (ref 38–126)
ALT: 36 U/L (ref 14–54)
AST: 37 U/L (ref 15–41)
Albumin: 4.1 g/dL (ref 3.5–5.0)
Anion gap: 6 (ref 5–15)
BUN: 8 mg/dL (ref 6–20)
CALCIUM: 8.9 mg/dL (ref 8.9–10.3)
CHLORIDE: 105 mmol/L (ref 101–111)
CO2: 25 mmol/L (ref 22–32)
Creatinine, Ser: 0.93 mg/dL (ref 0.44–1.00)
GLUCOSE: 82 mg/dL (ref 65–99)
POTASSIUM: 4.1 mmol/L (ref 3.5–5.1)
SODIUM: 136 mmol/L (ref 135–145)
Total Bilirubin: 0.5 mg/dL (ref 0.3–1.2)
Total Protein: 7.6 g/dL (ref 6.5–8.1)

## 2014-08-24 LAB — CBC WITH DIFFERENTIAL/PLATELET
BASOS ABS: 0 10*3/uL (ref 0.0–0.1)
BASOS PCT: 0 % (ref 0–1)
EOS PCT: 3 % (ref 0–5)
Eosinophils Absolute: 0.2 10*3/uL (ref 0.0–0.7)
HEMATOCRIT: 42.3 % (ref 36.0–46.0)
HEMOGLOBIN: 14.1 g/dL (ref 12.0–15.0)
LYMPHS PCT: 40 % (ref 12–46)
Lymphs Abs: 2.2 10*3/uL (ref 0.7–4.0)
MCH: 30.2 pg (ref 26.0–34.0)
MCHC: 33.3 g/dL (ref 30.0–36.0)
MCV: 90.6 fL (ref 78.0–100.0)
MONO ABS: 0.7 10*3/uL (ref 0.1–1.0)
Monocytes Relative: 13 % — ABNORMAL HIGH (ref 3–12)
NEUTROS ABS: 2.4 10*3/uL (ref 1.7–7.7)
Neutrophils Relative %: 44 % (ref 43–77)
PLATELETS: 166 10*3/uL (ref 150–400)
RBC: 4.67 MIL/uL (ref 3.87–5.11)
RDW: 12.5 % (ref 11.5–15.5)
WBC: 5.4 10*3/uL (ref 4.0–10.5)

## 2014-08-24 LAB — WET PREP, GENITAL
TRICH WET PREP: NONE SEEN
Yeast Wet Prep HPF POC: NONE SEEN

## 2014-08-24 LAB — URINE MICROSCOPIC-ADD ON

## 2014-08-24 LAB — POC URINE PREG, ED: PREG TEST UR: NEGATIVE

## 2014-08-24 MED ORDER — AZITHROMYCIN 250 MG PO TABS
1000.0000 mg | ORAL_TABLET | Freq: Once | ORAL | Status: AC
  Administered 2014-08-24: 1000 mg via ORAL
  Filled 2014-08-24: qty 4

## 2014-08-24 MED ORDER — OXYCODONE-ACETAMINOPHEN 5-325 MG PO TABS
2.0000 | ORAL_TABLET | Freq: Once | ORAL | Status: AC
  Administered 2014-08-24: 2 via ORAL
  Filled 2014-08-24: qty 2

## 2014-08-24 MED ORDER — LIDOCAINE HCL 1 % IJ SOLN
INTRAMUSCULAR | Status: AC
  Administered 2014-08-24: 0.9 mL
  Filled 2014-08-24: qty 20

## 2014-08-24 MED ORDER — CEFTRIAXONE SODIUM 250 MG IJ SOLR
250.0000 mg | Freq: Once | INTRAMUSCULAR | Status: AC
  Administered 2014-08-24: 250 mg via INTRAMUSCULAR
  Filled 2014-08-24: qty 250

## 2014-08-24 MED ORDER — METRONIDAZOLE 500 MG PO TABS: 500.0000 mg | ORAL_TABLET | Freq: Two times a day (BID) | ORAL | Status: DC

## 2014-08-24 MED ORDER — NAPROXEN 500 MG PO TABS: 500.0000 mg | ORAL_TABLET | Freq: Two times a day (BID) | ORAL | Status: DC

## 2014-08-24 NOTE — ED Notes (Signed)
Pt complaint of abdominal pain with vaginal bleeding for 3 weeks and headache for 1 week; Implanon expired for 3 months.

## 2014-08-24 NOTE — Discharge Instructions (Signed)
You have a cervical infection called bacterial vaginosis. Prescription for antibiotic and pain medicine. Follow-up your doctor.

## 2014-08-24 NOTE — ED Provider Notes (Signed)
CSN: [id number]     Arrival date & time 08/24/14  1712 History   First MD Initiated Contact with Patient 08/24/14 1927     Chief Complaint  Patient presents with  . Abdominal Pain  . Headache     (Consider location/radiation/quality/duration/timing/severity/associated sxs/prior Treatment) HPI.... Suprapubic discomfort along with white malodorous vaginal discharge for several days. She is sexually active. No fever, sweats, chills, dysuria. She has had a birth control implant in her left arm for 3 years. She has been able to eat without nausea or vomiting. Severity is mild to moderate.  History reviewed. No pertinent past medical history. History reviewed. No pertinent past surgical history. No family history on file. History  Substance Use Topics  . Smoking status: Current Every Day Smoker    Types: Cigarettes  . Smokeless tobacco: Not on file  . Alcohol Use: No   OB History    No data available     Review of Systems  All other systems reviewed and are negative.     Allergies  Review of patient's allergies indicates no known allergies.  Home Medications   Prior to Admission medications   Medication Sig Start Date End Date Taking? Authorizing Provider  metroNIDAZOLE (FLAGYL) 500 MG tablet Take 1 tablet (500 mg total) by mouth 2 (two) times daily. 08/24/14   Donnetta Hutching, MD  naproxen (NAPROSYN) 500 MG tablet Take 1 tablet (500 mg total) by mouth 2 (two) times daily. 08/24/14   Donnetta Hutching, MD   BP 136/85 mmHg  Pulse 64  Temp(Src) 98.6 F (37 C) (Oral)  Resp 16  SpO2 100%  LMP 08/03/2014 Physical Exam  Constitutional: She is oriented to person, place, and time. She appears well-developed and well-nourished.  HENT:  Head: Normocephalic and atraumatic.  Eyes: Conjunctivae and EOM are normal. Pupils are equal, round, and reactive to light.  Neck: Normal range of motion. Neck supple.  Cardiovascular: Normal rate and regular rhythm.   Pulmonary/Chest: Effort normal and  breath sounds normal.  Abdominal: Soft. Bowel sounds are normal.  Minimal lower abdominal tenderness  Genitourinary:  Pelvic exam: Normal external genitalia. Minimal cervical motion tenderness. No obvious discharge. Adnexa normal  Musculoskeletal: Normal range of motion.  Neurological: She is alert and oriented to person, place, and time.  Skin: Skin is warm and dry.  Psychiatric: She has a normal mood and affect. Her behavior is normal.  Nursing note and vitals reviewed.   ED Course  Procedures (including critical care time) Labs Review Labs Reviewed  WET PREP, GENITAL - Abnormal; Notable for the following:    Clue Cells Wet Prep HPF POC FEW (*)    WBC, Wet Prep HPF POC FEW (*)    All other components within normal limits  CBC WITH DIFFERENTIAL/PLATELET - Abnormal; Notable for the following:    Monocytes Relative 13 (*)    All other components within normal limits  URINALYSIS, ROUTINE W REFLEX MICROSCOPIC (NOT AT [facility]) - Abnormal; Notable for the following:    APPearance CLOUDY (*)    Hgb urine dipstick LARGE (*)    Leukocytes, UA TRACE (*)    All other components within normal limits  URINE MICROSCOPIC-ADD ON - Abnormal; Notable for the following:    Squamous Epithelial / LPF MANY (*)    Bacteria, UA MANY (*)    All other components within normal limits  COMPREHENSIVE METABOLIC PANEL  POC URINE PREG, ED  GC/CHLAMYDIA PROBE AMP ([place]) NOT AT [facility]    Imaging Review  No results found.   EKG Interpretation None      MDM   Final diagnoses:  Cervicitis  Bacterial vaginosis    No acute abdomen. Will Rx for cervicitis with Rocephin 250 mg IM and Zithromax 1 g orally. Discharge medications Flagyl 500 mg twice a day and Naprosyn 500 mg.    Donnetta Hutching, MD 08/24/14 2325

## 2014-08-27 LAB — GC/CHLAMYDIA PROBE AMP (~~LOC~~) NOT AT ARMC
Chlamydia: NEGATIVE
NEISSERIA GONORRHEA: NEGATIVE

## 2015-08-13 ENCOUNTER — Ambulatory Visit: Payer: Medicaid Other | Admitting: Certified Nurse Midwife

## 2015-09-04 ENCOUNTER — Encounter: Payer: Self-pay | Admitting: Certified Nurse Midwife

## 2015-09-04 ENCOUNTER — Ambulatory Visit (INDEPENDENT_AMBULATORY_CARE_PROVIDER_SITE_OTHER): Payer: Medicaid Other | Admitting: Certified Nurse Midwife

## 2015-09-04 VITALS — BP 126/83 | HR 61 | Ht 64.0 in | Wt 172.0 lb

## 2015-09-04 DIAGNOSIS — Z3049 Encounter for surveillance of other contraceptives: Secondary | ICD-10-CM

## 2015-09-04 DIAGNOSIS — N760 Acute vaginitis: Secondary | ICD-10-CM

## 2015-09-04 DIAGNOSIS — B9689 Other specified bacterial agents as the cause of diseases classified elsewhere: Secondary | ICD-10-CM

## 2015-09-04 DIAGNOSIS — Z01419 Encounter for gynecological examination (general) (routine) without abnormal findings: Secondary | ICD-10-CM

## 2015-09-04 DIAGNOSIS — Z113 Encounter for screening for infections with a predominantly sexual mode of transmission: Secondary | ICD-10-CM

## 2015-09-04 LAB — POCT URINE PREGNANCY: PREG TEST UR: NEGATIVE

## 2015-09-04 MED ORDER — CLINDAMYCIN PHOSPHATE 100 MG VA SUPP: 100.0000 mg | Freq: Every day | VAGINAL | Status: DC

## 2015-09-04 NOTE — Progress Notes (Signed)
Patient ID: Morgan Morales, female   DOB: [birth year], 25 y.o.   MRN: [id number]    Subjective:        Morgan Morales is a 25 y.o. female here for a routine exam.  Current complaints: none, needs contraception.  [place] Has had Nexplanon in the past, amenorrhea with it.   Smoker, 7-8 cigs/day.   Currently sexually active.  Period is due around 09-15-15.  Desires full STD screening exam.    Personal health questionnaire:  Is patient Ashkenazi Jewish, have a family history of breast and/or ovarian cancer: no Is there a family history of uterine cancer diagnosed at age < 7550, gastrointestinal cancer, urinary tract cancer, family member who is a Personnel officerLynch syndrome-associated carrier: no Is the patient overweight and hypertensive, family history of diabetes, personal history of gestational diabetes, preeclampsia or PCOS: yes Is patient over 3455, have PCOS,  family history of premature CHD under age 24, diabetes, smoke, have hypertension or peripheral artery disease:  yes At any time, has a partner hit, kicked or otherwise hurt or frightened you?: no Over the past 2 weeks, have you felt down, depressed or hopeless?: no Over the past 2 weeks, have you felt little interest or pleasure in doing things?:no   Gynecologic History Patient's last menstrual period was 08/16/2015. Contraception: none Last Pap: unknown. Results were: normal according to the patient Last mammogram: N/A.   Obstetric History OB History  Gravida Para Term Preterm AB SAB TAB Ectopic Multiple Living  0 0 0 0 0 0 0 0 0 0         History reviewed. No pertinent past medical history.  History reviewed. No pertinent past surgical history.   Current outpatient prescriptions:  .  clindamycin (CLEOCIN) 100 MG vaginal suppository, Place 1 suppository (100 mg total) vaginally at bedtime., Disp: 6 suppository, Rfl: 4 .  naproxen (NAPROSYN) 500 MG tablet, Take 1 tablet (500 mg total) by mouth 2 (two) times  daily. (Patient not taking: Reported on 09/04/2015), Disp: 30 tablet, Rfl: 0 No Known Allergies  Social History  Substance Use Topics  . Smoking status: Current Every Day Smoker    Types: Cigarettes  . Smokeless tobacco: Not on file     Comment: 8 cig/day  . Alcohol Use: 0.0 oz/week    0 Standard drinks or equivalent per week     Comment: occ    History reviewed. No pertinent family history.    Review of Systems  Constitutional: negative for fatigue and weight loss Respiratory: negative for cough and wheezing Cardiovascular: negative for chest pain, fatigue and palpitations Gastrointestinal: negative for abdominal pain and change in bowel habits Musculoskeletal:negative for myalgias Neurological: negative for gait problems and tremors Behavioral/Psych: negative for abusive relationship, depression Endocrine: negative for temperature intolerance   Genitourinary:negative for abnormal menstrual periods, genital lesions, hot flashes, sexual problems and vaginal discharge Integument/breast: negative for breast lump, breast tenderness, nipple discharge and skin lesion(s)    Objective:       BP 126/83 mmHg  Pulse 61  Ht 5\' 4"  (1.626 m)  Wt 172 lb (78.019 kg)  BMI 29.51 kg/m2  LMP 08/16/2015 General:   alert  Skin:   no rash or abnormalities  Lungs:   clear to auscultation bilaterally  Heart:   regular rate and rhythm, S1, S2 normal, no murmur, click, rub or gallop  Breasts:    normal without suspicious masses, skin or nipple changes or axillary nodes    Abdomen:  normal findings:  no organomegaly, soft, non-tender and no hernia  Pelvis:  External genitalia: normal general appearance Urinary system: urethral meatus normal and bladder without fullness, nontender Vaginal: normal without tenderness, induration or masses Cervix: normal appearance Adnexa: normal bimanual exam Uterus: anteverted and non-tender, normal size   Lab Review Urine pregnancy test Labs reviewed  yes Radiologic studies reviewed no  50% of 30 min visit spent on counseling and coordination of care.   Assessment:    Healthy female exam.   Contraception management: counseling  BV  Plan:    Education reviewed: calcium supplements, depression evaluation, low fat, low cholesterol diet, safe sex/STD prevention, self breast exams, skin cancer screening and weight bearing exercise. Contraception: Nexplanon. Follow up in: 3 months.   Meds ordered this encounter  Medications  . clindamycin (CLEOCIN) 100 MG vaginal suppository    Sig: Place 1 suppository (100 mg total) vaginally at bedtime.    Dispense:  6 suppository    Refill:  4   Orders Placed This Encounter  Procedures  . Hepatitis B surface antigen  . RPR  . Hepatitis C antibody  . HIV antibody  . CBC with Differential/Platelet  . Comprehensive metabolic panel  . TSH  . POCT urine pregnancy    Possible management options include: Kyleena IUD, Depo injections Follow up September for Nexplanon removal

## 2015-09-05 LAB — COMPREHENSIVE METABOLIC PANEL
ALBUMIN: 4.3 g/dL (ref 3.5–5.5)
ALK PHOS: 74 IU/L (ref 39–117)
ALT: 22 IU/L (ref 0–32)
AST: 23 IU/L (ref 0–40)
Albumin/Globulin Ratio: 1.7 (ref 1.2–2.2)
BILIRUBIN TOTAL: 0.3 mg/dL (ref 0.0–1.2)
BUN / CREAT RATIO: 5 — AB (ref 9–23)
BUN: 4 mg/dL — ABNORMAL LOW (ref 6–20)
CHLORIDE: 99 mmol/L (ref 96–106)
CO2: 24 mmol/L (ref 18–29)
Calcium: 9.5 mg/dL (ref 8.7–10.2)
Creatinine, Ser: 0.86 mg/dL (ref 0.57–1.00)
GFR calc non Af Amer: 96 mL/min/{1.73_m2} (ref >59)
GFR, EST AFRICAN AMERICAN: 110 mL/min/{1.73_m2} (ref >59)
GLOBULIN, TOTAL: 2.6 g/dL (ref 1.5–4.5)
Glucose: 85 mg/dL (ref 65–99)
Potassium: 4.5 mmol/L (ref 3.5–5.2)
SODIUM: 139 mmol/L (ref 134–144)
Total Protein: 6.9 g/dL (ref 6.0–8.5)

## 2015-09-05 LAB — HEPATITIS C ANTIBODY: Hep C Virus Ab: 0.2 s/co ratio (ref 0.0–0.9)

## 2015-09-05 LAB — HIV ANTIBODY (ROUTINE TESTING W REFLEX): HIV Screen 4th Generation wRfx: NONREACTIVE

## 2015-09-05 LAB — RPR: RPR: NONREACTIVE

## 2015-09-05 LAB — CBC WITH DIFFERENTIAL/PLATELET
BASOS: 0 %
Basophils Absolute: 0 10*3/uL (ref 0.0–0.2)
EOS (ABSOLUTE): 0.2 10*3/uL (ref 0.0–0.4)
EOS: 2 %
HEMATOCRIT: 47.5 % — AB (ref 34.0–46.6)
Hemoglobin: 15.6 g/dL (ref 11.1–15.9)
Immature Grans (Abs): 0 10*3/uL (ref 0.0–0.1)
Immature Granulocytes: 0 %
LYMPHS ABS: 2.8 10*3/uL (ref 0.7–3.1)
Lymphs: 34 %
MCH: 31.3 pg (ref 26.6–33.0)
MCHC: 32.8 g/dL (ref 31.5–35.7)
MCV: 95 fL (ref 79–97)
Monocytes Absolute: 0.8 10*3/uL (ref 0.1–0.9)
Monocytes: 10 %
Neutrophils Absolute: 4.4 10*3/uL (ref 1.4–7.0)
Neutrophils: 54 %
Platelets: 192 10*3/uL (ref 150–379)
RBC: 4.98 x10E6/uL (ref 3.77–5.28)
RDW: 13.5 % (ref 12.3–15.4)
WBC: 8.3 10*3/uL (ref 3.4–10.8)

## 2015-09-05 LAB — TSH: TSH: 0.486 u[IU]/mL (ref 0.450–4.500)

## 2015-09-05 LAB — HEPATITIS B SURFACE ANTIGEN: Hepatitis B Surface Ag: NEGATIVE

## 2015-09-06 ENCOUNTER — Other Ambulatory Visit: Payer: Self-pay | Admitting: Certified Nurse Midwife

## 2015-09-06 DIAGNOSIS — B373 Candidiasis of vulva and vagina: Secondary | ICD-10-CM

## 2015-09-06 DIAGNOSIS — B3731 Acute candidiasis of vulva and vagina: Secondary | ICD-10-CM

## 2015-09-06 LAB — NUSWAB VG+, CANDIDA 6SP
Atopobium vaginae: HIGH Score — AB
BVAB 2: HIGH Score — AB
CANDIDA PARAPSILOSIS, NAA: NEGATIVE
Candida albicans, NAA: POSITIVE — AB
Candida glabrata, NAA: NEGATIVE
Candida krusei, NAA: NEGATIVE
Candida lusitaniae, NAA: NEGATIVE
Candida tropicalis, NAA: NEGATIVE
Chlamydia trachomatis, NAA: NEGATIVE
Neisseria gonorrhoeae, NAA: NEGATIVE
TRICH VAG BY NAA: NEGATIVE

## 2015-09-06 LAB — PAP IG W/ RFLX HPV ASCU: PAP Smear Comment: 0

## 2015-09-06 MED ORDER — FLUCONAZOLE 200 MG PO TABS: 200.0000 mg | ORAL_TABLET | Freq: Once | ORAL | Status: DC

## 2015-09-06 MED ORDER — TERCONAZOLE 0.8 % VA CREA: 1.0000 | TOPICAL_CREAM | Freq: Every day | VAGINAL | Status: DC

## 2015-09-12 ENCOUNTER — Telehealth: Payer: Self-pay | Admitting: *Deleted

## 2015-09-12 NOTE — Telephone Encounter (Signed)
See telephone note for this encounter.

## 2015-09-19 ENCOUNTER — Ambulatory Visit (INDEPENDENT_AMBULATORY_CARE_PROVIDER_SITE_OTHER): Payer: Medicaid Other | Admitting: Certified Nurse Midwife

## 2015-09-19 ENCOUNTER — Encounter: Payer: Self-pay | Admitting: Certified Nurse Midwife

## 2015-09-19 VITALS — BP 120/82 | HR 75 | Temp 99.5°F | Resp 16 | Wt 170.0 lb

## 2015-09-19 DIAGNOSIS — B9689 Other specified bacterial agents as the cause of diseases classified elsewhere: Secondary | ICD-10-CM

## 2015-09-19 DIAGNOSIS — Z3049 Encounter for surveillance of other contraceptives: Secondary | ICD-10-CM

## 2015-09-19 DIAGNOSIS — N760 Acute vaginitis: Secondary | ICD-10-CM

## 2015-09-19 DIAGNOSIS — Z01818 Encounter for other preprocedural examination: Secondary | ICD-10-CM

## 2015-09-19 DIAGNOSIS — B373 Candidiasis of vulva and vagina: Secondary | ICD-10-CM

## 2015-09-19 DIAGNOSIS — Z30017 Encounter for initial prescription of implantable subdermal contraceptive: Secondary | ICD-10-CM

## 2015-09-19 DIAGNOSIS — B3731 Acute candidiasis of vulva and vagina: Secondary | ICD-10-CM

## 2015-09-19 MED ORDER — METRONIDAZOLE 500 MG PO TABS: 500.0000 mg | ORAL_TABLET | Freq: Two times a day (BID) | ORAL | Status: DC

## 2015-09-19 MED ORDER — TERCONAZOLE 0.8 % VA CREA: 1.0000 | TOPICAL_CREAM | Freq: Every day | VAGINAL | Status: DC

## 2015-09-19 MED ORDER — FLUCONAZOLE 200 MG PO TABS: 200.0000 mg | ORAL_TABLET | Freq: Once | ORAL | Status: DC

## 2015-09-19 NOTE — Progress Notes (Signed)
Patient ID: Morgan Morales, female   DOB: 01/14/1991, 25 y.o.   MRN: [id number]  Nexplanon Procedure Note   PRE-OP DIAGNOSIS: desired long-term, reversible contraception  POST-OP DIAGNOSIS: Same  PROCEDURE: Nexplanon  placement Performing Provider: Orvilla Cornwallachelle Dalilah Curlin CNM   Patient education prior to procedure, explained risk, benefits of Nexplanon, reviewed alternative options. Patient reported understanding. Gave consent to continue with procedure.   PROCEDURE:  Pregnancy Text :  Negative Site (check):      left arm         Sterile Preparation:   Betadinex3 Lot #  [id number] Expiration Date 09/2017  Insertion site was selected 8 - 10 cm from medial epicondyle and marked along with guiding site using sterile marker. Procedure area was prepped and draped in a sterile fashion. 1% Lidocaine 1.5 ml given prior to procedure. Nexplanon  was inserted subcutaneously.Needle was removed from the insertion site. Nexplanon capsule was palpated by provider and patient to assure satisfactory placement. And a bandage applied and the arm was wrapped with gauze bandage.     Followup: The patient tolerated the procedure well without complications.  Instructions:  The patient was instructed to remove the dressing in 24 hours and that some bruising is to be expected.  She was advised to use over the counter analgesics as needed for any pain at the site.  She is to keep the area dry for 24 hours and to call if her hand or arm becomes cold, numb, or blue.   Orvilla Cornwallachelle Athens Lebeau CNM

## 2016-01-28 IMAGING — CT CT ABD-PELV W/ CM
2 of 4 series · 16 of 46 positions shown, 18 images · IV contrast (Omni 300)
Comparison: None

CLINICAL DATA: Lower abdominal pain for 4 days

EXAM:
CT ABDOMEN AND PELVIS WITH CONTRAST
TECHNIQUE: Multidetector CT imaging of the abdomen and pelvis was performed
using the standard protocol following bolus administration of
intravenous contrast.
CONTRAST:  25mL OMNIPAQUE IOHEXOL 300 MG/ML SOLN, 100mL OMNIPAQUE
IOHEXOL 300 MG/ML SOLN

[Series 2: abd/ pelvis 5.0 i30f 1 · axial · 0.77mm/px · z∈[-724,-310]mm · 13 of 91 slices shown, 15 images]
[im 4/91  soft-tissue]
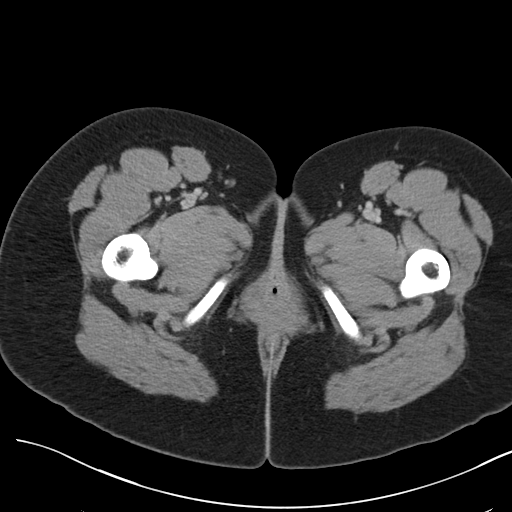
[im 4/91  bone]
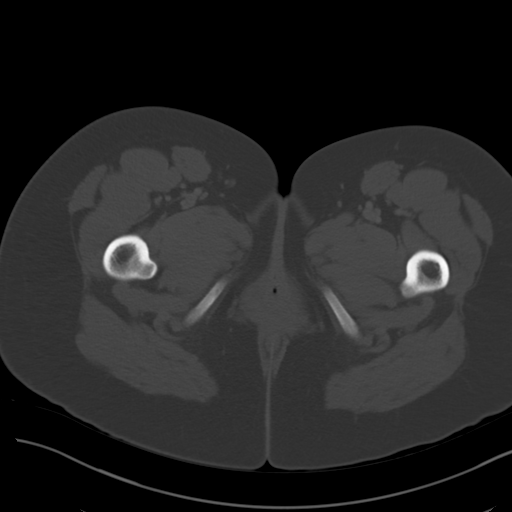
[im 12/91  soft-tissue]
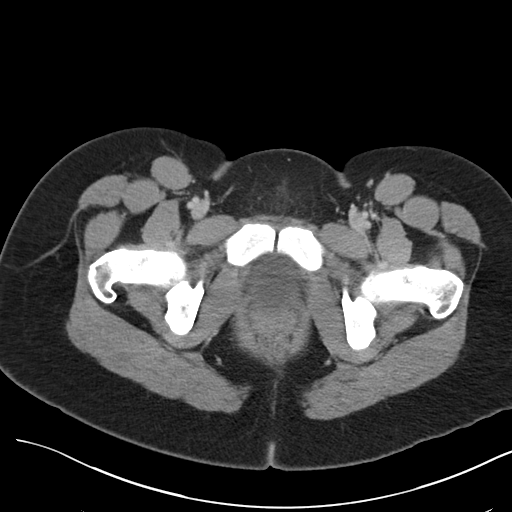
[im 19/91  soft-tissue]
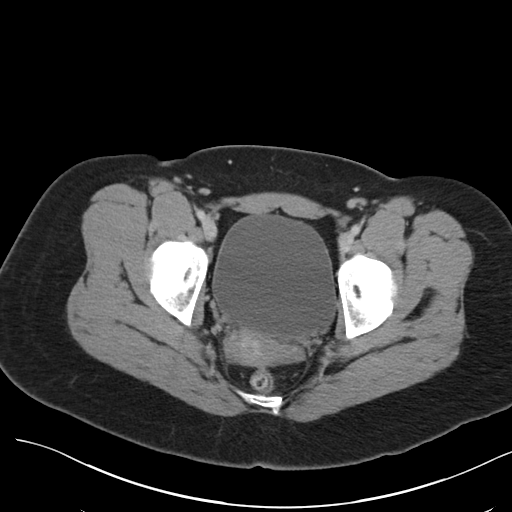
[im 27/91  soft-tissue]
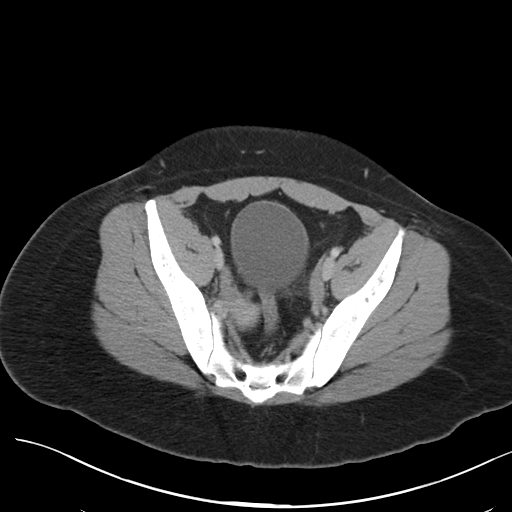
[im 31/91  soft-tissue]
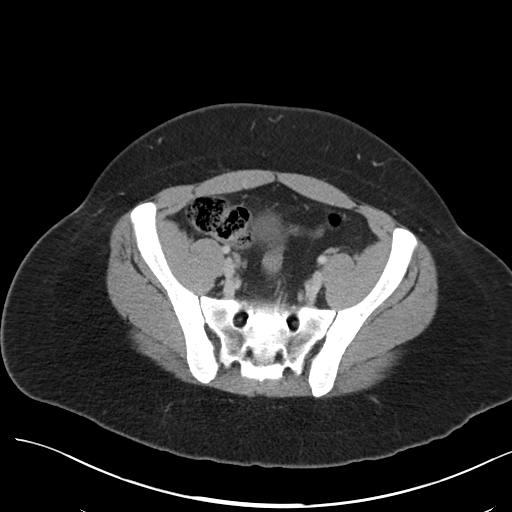
[im 38/91  soft-tissue]
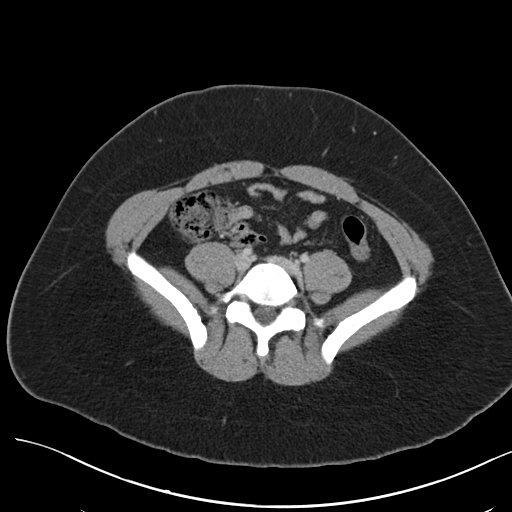
[im 46/91  soft-tissue]
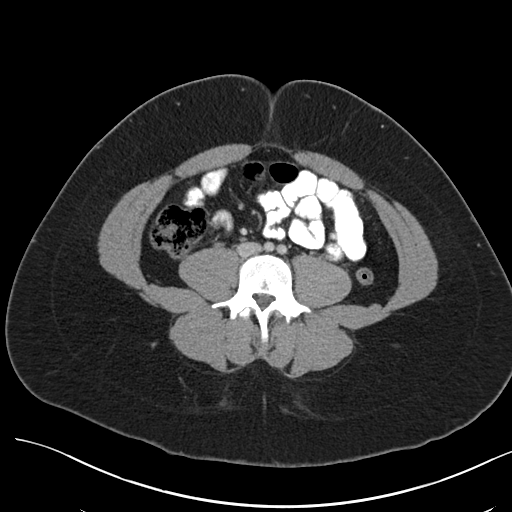
[im 53/91  soft-tissue]
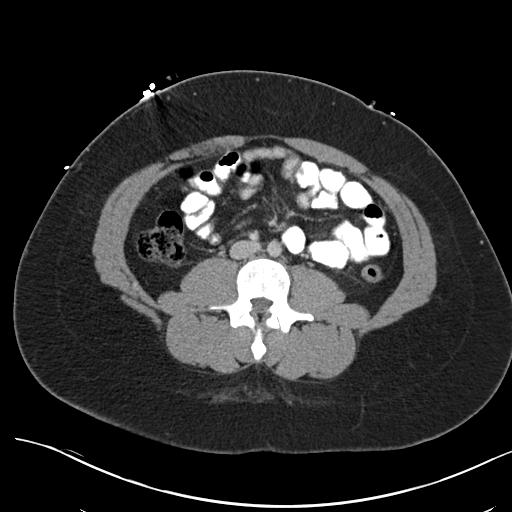
[im 61/91  soft-tissue]
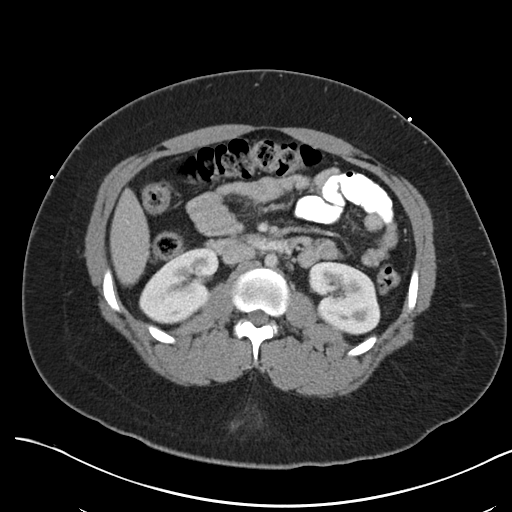
[im 61/91  bone]
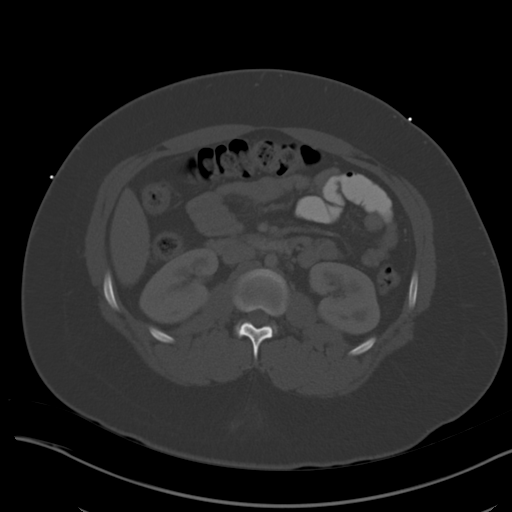
[im 64/91  soft-tissue]
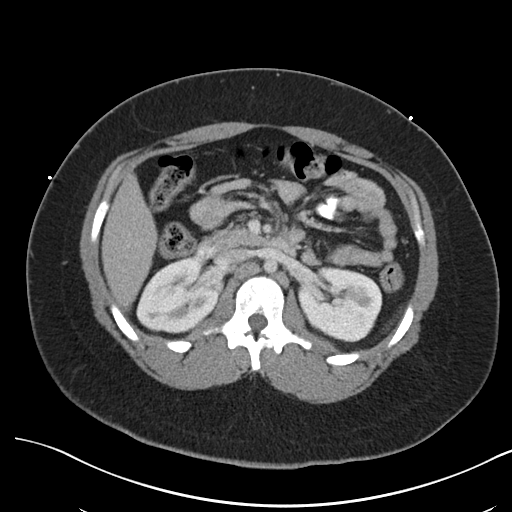
[im 72/91  soft-tissue]
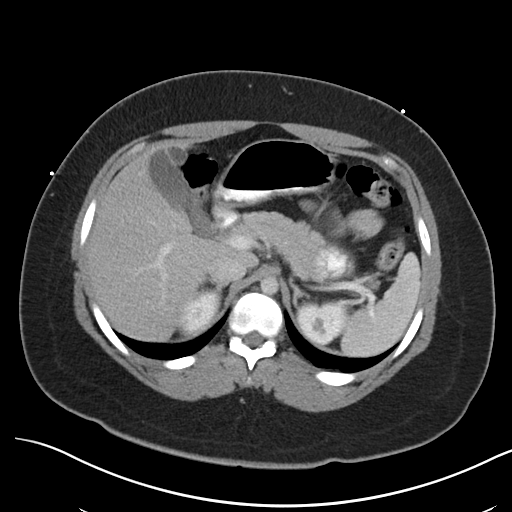
[im 79/91  soft-tissue]
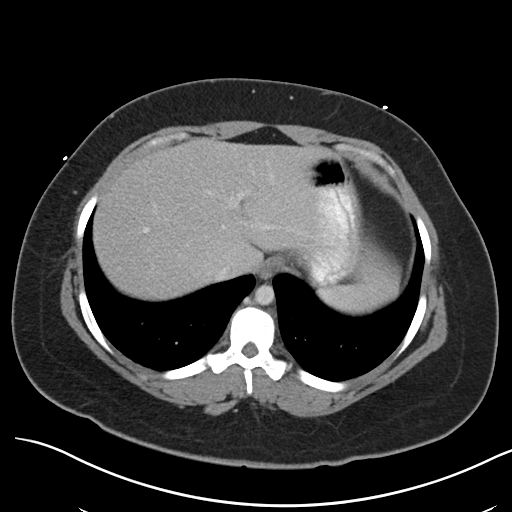
[im 87/91  soft-tissue]
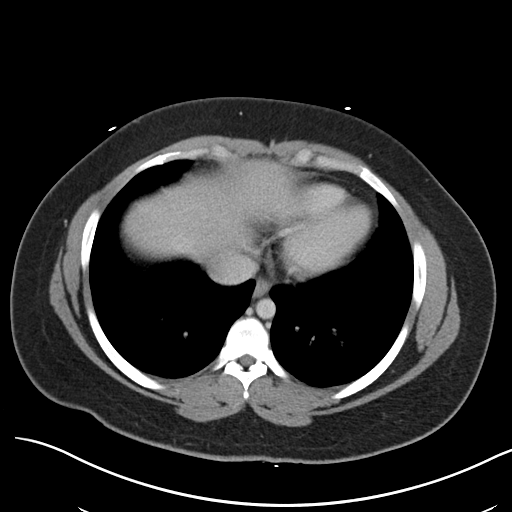

[Series 5: coronals · coronal · 0.70mm/px · 3 of 115 slices shown]
[im 39/115  soft-tissue]
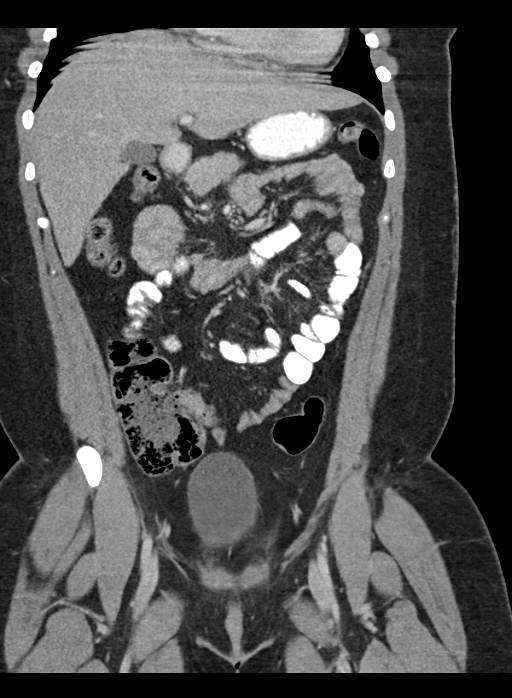
[im 51/115  soft-tissue]
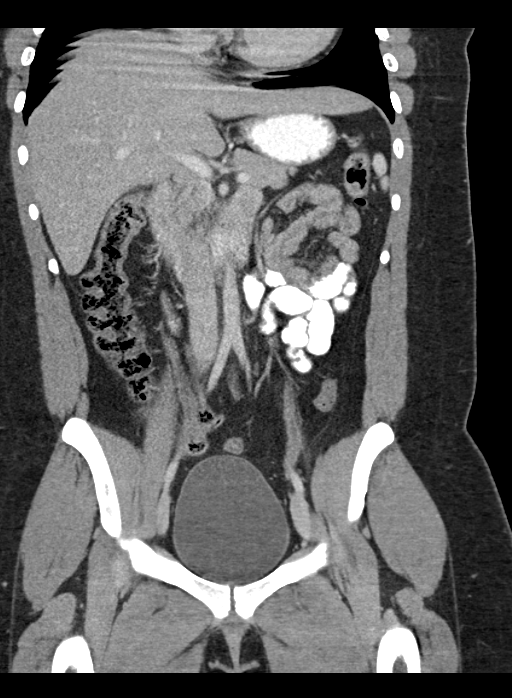
[im 64/115  soft-tissue]
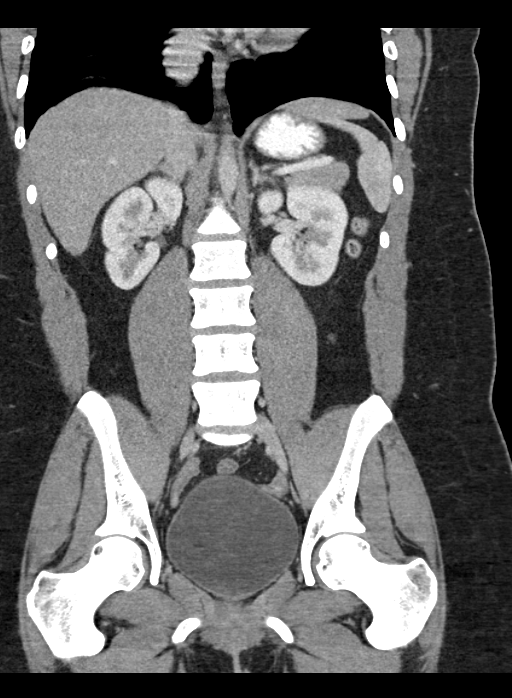

[16 of 46 positions shown; findings below may reference images not displayed]

FINDINGS: The lung bases are clear.  No pleural or pericardial effusion.

There is no focal liver abnormality. The gallbladder appears normal.
No biliary dilatation. Normal appearance of the pancreas. The spleen
is unremarkable.

The adrenal glands are both normal. The kidneys are both
unremarkable. The urinary bladder appears normal. The uterus and
adnexal structures have a normal appearance.

Normal caliber of the abdominal aorta. No retroperitoneal
adenopathy. No mesenteric adenopathy. There is no enlarged pelvic or
inguinal adenopathy identified. No free fluid or fluid collections
within the abdomen or the pelvis.

The stomach appears normal. The duodenum and jejunum have a normal
appearance. The ileum and terminal ileum are normal. The appendix is
visualized and appears normal. Normal appearance of the proximal
colon. The distal colon is unremarkable.

Review of the visualized osseous structures is unremarkable.
IMPRESSION: 1. No acute findings identified within the abdomen or pelvis.

## 2016-03-22 ENCOUNTER — Encounter (HOSPITAL_COMMUNITY): Payer: Self-pay | Admitting: Emergency Medicine

## 2016-03-22 ENCOUNTER — Emergency Department (HOSPITAL_COMMUNITY)
Admission: EM | Admit: 2016-03-22 | Discharge: 2016-03-22 | Disposition: A | Discharge status: Home / Self Care | Payer: Medicaid Other | Attending: Emergency Medicine | Admitting: Emergency Medicine

## 2016-03-22 DIAGNOSIS — F17210 Nicotine dependence, cigarettes, uncomplicated: Secondary | ICD-10-CM | POA: Insufficient documentation

## 2016-03-22 DIAGNOSIS — K0889 Other specified disorders of teeth and supporting structures: Secondary | ICD-10-CM

## 2016-03-22 MED ORDER — PENICILLIN V POTASSIUM 500 MG PO TABS
500.0000 mg | ORAL_TABLET | Freq: Once | ORAL | Status: AC
  Administered 2016-03-22: 500 mg via ORAL
  Filled 2016-03-22: qty 1

## 2016-03-22 MED ORDER — IBUPROFEN 800 MG PO TABS
800.0000 mg | ORAL_TABLET | Freq: Once | ORAL | Status: AC
  Administered 2016-03-22: 800 mg via ORAL
  Filled 2016-03-22: qty 1

## 2016-03-22 MED ORDER — PENICILLIN V POTASSIUM 500 MG PO TABS: 500.0000 mg | ORAL_TABLET | Freq: Four times a day (QID) | ORAL | 0 refills | Status: AC

## 2016-03-22 MED ORDER — IBUPROFEN 800 MG PO TABS: 800.0000 mg | ORAL_TABLET | Freq: Three times a day (TID) | ORAL | 0 refills | Status: DC

## 2016-03-22 MED ORDER — ACETAMINOPHEN 325 MG PO TABS: 650.0000 mg | ORAL_TABLET | Freq: Four times a day (QID) | ORAL | 0 refills | Status: DC | PRN

## 2016-03-22 NOTE — Discharge Instructions (Signed)
Medications: Penicillin, ibuprofen, Tylenol  Treatment: take penicillin as prescribed for 7 days. Make sure to finish all of this medication. Take ibuprofen every 8 hours for your pain. You can alternate with Tylenol in between, every 4-6 hours.  Follow-up: Please see your dentist or the one below as soon as possible for further evaluation and treatment of your dental pain. I have also attached other dental resources in the community.Please return to the emergency department if you develop any new or worsening symptoms.

## 2016-03-22 NOTE — ED Provider Notes (Signed)
[facility]-EMERGENCY DEPT Provider Note   CSN: [id number] Arrival date & time: 03/22/16  1633  By signing my name below, I, Orpah Cobb, attest that this documentation has been prepared under the direction and in the presence of Buel Ream, PA-C. Electronically Signed: Orpah Cobb , ED Scribe. 03/16/16. 4:20 PM.   History   Chief Complaint Chief Complaint  Patient presents with  . Dental Pain    HPI  Morgan Morales is a 26 y.o. female who presents to the Emergency Department complaining of mild to moderate R sided dental pain with gradual onset x1 month. Pt states that she has had R sided dental pain for the past month. She states that she is unsure if the pain is coming from needing to have her wisdom teeth pulled or the 2 fillings that have fallen out recently. She states that she has an upcoming dental appointment in March. Pt reports R sided headache and states that the dental pain radiates to the neck. Pt has taken BC powder with no relief. She denies fever, chest pain, trouble breathing, abdominal pain, nausea and vomiting. Of note, pt denies known allergies to antibiotics.   The history is provided by the patient. No language interpreter was used.    History reviewed. No pertinent past medical history.  Patient Active Problem List   Diagnosis Date Noted  . Encounter for initial prescription of Nexplanon 09/19/2015  . OBESITY, NOS 04/29/2006  . 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 ACNE 04/29/2006    History reviewed. No pertinent surgical history.  OB History    Gravida Para Term Preterm AB Living   0 0 0 0 0 0   SAB TAB Ectopic Multiple Live Births   0 0 0 0         Home Medications    Prior to Admission medications   Medication Sig Start Date End Date Taking? Authorizing Provider  acetaminophen (TYLENOL) 325 MG tablet Take 2 tablets (650 mg total) by mouth every 6 (six) hours as needed. 03/22/16   Emi Holes, PA-C  fluconazole (DIFLUCAN) 200 MG  tablet Take 1 tablet (200 mg total) by mouth once. Repeat dose in 48-72 hours. 09/19/15   Rachelle A Denney, CNM  ibuprofen (ADVIL,MOTRIN) 800 MG tablet Take 1 tablet (800 mg total) by mouth 3 (three) times daily. 03/22/16   Emi Holes, PA-C  metroNIDAZOLE (FLAGYL) 500 MG tablet Take 1 tablet (500 mg total) by mouth 2 (two) times daily. 09/19/15   Rachelle A Denney, CNM  naproxen (NAPROSYN) 500 MG tablet Take 1 tablet (500 mg total) by mouth 2 (two) times daily. Patient not taking: Reported on 09/04/2015 08/24/14   Donnetta Hutching, MD  penicillin v potassium (VEETID) 500 MG tablet Take 1 tablet (500 mg total) by mouth 4 (four) times daily. 03/22/16 03/29/16  Emi Holes, PA-C  terconazole (TERAZOL 3) 0.8 % vaginal cream Place 1 applicator vaginally at bedtime. 09/19/15   Roe Coombs, CNM    Family History No family history on file.  Social History Social History  Substance Use Topics  . Smoking status: Current Every Day Smoker    Packs/day: 0.50    Types: Cigarettes  . Smokeless tobacco: Never Used     Comment: 8 cig/day  . Alcohol use 0.0 oz/week     Comment: occ     Allergies   Patient has no known allergies.   Review of Systems Review of Systems  Constitutional: Negative for chills and fever.  HENT:  Positive for dental problem. Negative for facial swelling and sore throat.   Respiratory: Negative for shortness of breath.   Cardiovascular: Negative for chest pain.  Gastrointestinal: Negative for abdominal pain, nausea and vomiting.  Genitourinary: Negative for dysuria.  Musculoskeletal: Negative for back pain.  Skin: Negative for rash and wound.  Neurological: Positive for headaches.  Psychiatric/Behavioral: The patient is not nervous/anxious.      Physical Exam Updated Vital Signs BP 133/81   Pulse 70   Temp 98.4 F (36.9 C) (Oral)   Resp 18   SpO2 98%   Physical Exam  Constitutional: She appears well-developed and well-nourished. No distress.  HENT:  Head:  Normocephalic and atraumatic.  Mouth/Throat: Oropharynx is clear and moist and mucous membranes are normal. No trismus in the jaw. No dental abscesses or uvula swelling. No oropharyngeal exudate, posterior oropharyngeal edema or posterior oropharyngeal erythema.    Eyes: Conjunctivae are normal. Pupils are equal, round, and reactive to light. Right eye exhibits no discharge. Left eye exhibits no discharge. No scleral icterus.  Neck: Normal range of motion. Neck supple. No spinous process tenderness and no muscular tenderness present. Normal range of motion present. No thyromegaly present.  Cardiovascular: Normal rate, regular rhythm, normal heart sounds and intact distal pulses.  Exam reveals no gallop and no friction rub.   No murmur heard. Pulmonary/Chest: Effort normal and breath sounds normal. No stridor. No respiratory distress. She has no wheezes. She has no rales.  Abdominal: Soft. Bowel sounds are normal. She exhibits no distension. There is no tenderness. There is no rebound and no guarding.  Musculoskeletal: She exhibits no edema.  Lymphadenopathy:    She has no cervical adenopathy.  Neurological: She is alert. Coordination normal.  Skin: Skin is warm and dry. No rash noted. She is not diaphoretic. No pallor.  Psychiatric: She has a normal mood and affect.  Nursing note and vitals reviewed.    ED Treatments / Results   DIAGNOSTIC STUDIES: Oxygen Saturation is 98% on RA, normal by my interpretation.   COORDINATION OF CARE: 7:05 PM-Discussed next steps with pt. Pt verbalized understanding and is agreeable with the plan.    Labs (all labs ordered are listed, but only abnormal results are displayed) Labs Reviewed - No data to display  EKG  EKG Interpretation None       Radiology No results found.  Procedures Procedures (including critical care time)  Medications Ordered in ED Medications  ibuprofen (ADVIL,MOTRIN) tablet 800 mg (800 mg Oral Given 03/22/16 1905)    penicillin v potassium (VEETID) tablet 500 mg (500 mg Oral Given 03/22/16 1904)     Initial Impression / Assessment and Plan / ED Course  I have reviewed the triage vital signs and the nursing notes.  Pertinent labs & imaging results that were available during my care of the patient were reviewed by me and considered in my medical decision making (see chart for details).     Patient with dentalgia.  No abscess requiring immediate incision and drainage.  Exam not concerning for Ludwig's angina or pharyngeal abscess.  Will treat with Penicillin, ibuprofen, Tylenol. Pt instructed to follow-up with dentist. If she cannot see her own sooner than March, given resources.  Discussed return precautions. Pt safe for discharge.   Final Clinical Impressions(s) / ED Diagnoses   Final diagnoses:  Pain, dental    New Prescriptions New Prescriptions   ACETAMINOPHEN (TYLENOL) 325 MG TABLET    Take 2 tablets (650 mg total) by mouth every  6 (six) hours as needed.   IBUPROFEN (ADVIL,MOTRIN) 800 MG TABLET    Take 1 tablet (800 mg total) by mouth 3 (three) times daily.   PENICILLIN V POTASSIUM (VEETID) 500 MG TABLET    Take 1 tablet (500 mg total) by mouth 4 (four) times daily.   I personally performed the services described in this documentation, which was scribed in my presence. The recorded information has been reviewed and is accurate.    Emi Holes, PA-C 03/22/16 1906    Linwood Dibbles, MD 03/25/16 2020

## 2016-03-22 NOTE — ED Triage Notes (Signed)
Patient states that she had right sided dental pain that started yesterday.  Patient states had some swellinng and  Then went away.  Patient states that she doesn't have dentist appt until March.

## 2017-04-22 ENCOUNTER — Other Ambulatory Visit: Payer: Self-pay

## 2017-04-22 ENCOUNTER — Encounter (HOSPITAL_COMMUNITY): Payer: Self-pay | Admitting: Emergency Medicine

## 2017-04-22 ENCOUNTER — Emergency Department (HOSPITAL_COMMUNITY)
Admission: EM | Admit: 2017-04-22 | Discharge: 2017-04-22 | Disposition: A | Discharge status: Home / Self Care | Payer: Self-pay | Attending: Emergency Medicine | Admitting: Emergency Medicine

## 2017-04-22 DIAGNOSIS — K029 Dental caries, unspecified: Secondary | ICD-10-CM | POA: Insufficient documentation

## 2017-04-22 DIAGNOSIS — K0889 Other specified disorders of teeth and supporting structures: Secondary | ICD-10-CM | POA: Insufficient documentation

## 2017-04-22 DIAGNOSIS — F17210 Nicotine dependence, cigarettes, uncomplicated: Secondary | ICD-10-CM | POA: Insufficient documentation

## 2017-04-22 MED ORDER — OXYCODONE-ACETAMINOPHEN 5-325 MG PO TABS
1.0000 | ORAL_TABLET | Freq: Once | ORAL | Status: AC
  Administered 2017-04-22: 1 via ORAL
  Filled 2017-04-22: qty 1

## 2017-04-22 MED ORDER — BUPIVACAINE-EPINEPHRINE (PF) 0.5% -1:200000 IJ SOLN
1.8000 mL | Freq: Once | INTRAMUSCULAR | Status: AC
  Administered 2017-04-22: 1.8 mL
  Filled 2017-04-22: qty 1.8

## 2017-04-22 MED ORDER — IBUPROFEN 800 MG PO TABS: 800.0000 mg | ORAL_TABLET | Freq: Three times a day (TID) | ORAL | 0 refills | Status: DC

## 2017-04-22 MED ORDER — LIDOCAINE VISCOUS 2 % MT SOLN: 15.0000 mL | OROMUCOSAL | 0 refills | Status: DC | PRN

## 2017-04-22 NOTE — ED Triage Notes (Signed)
Pt complaint of wisdom teeth pain worsening for 3 days; has dental appointment scheduled 3/4.

## 2017-04-22 NOTE — ED Provider Notes (Signed)
[facility]-EMERGENCY DEPT Provider Note   CSN: [id number] Arrival date & time: 04/22/17  [id number]     History   Chief Complaint Chief Complaint  Patient presents with  . Dental Pain    HPI Morgan Morales is a 27 y.o. female presents to the emergency department with left lower dental pain for 3 days.  She has a dentist appointment scheduled on 3/4, but is unable to be sooner.  The pain is constant, sharp, and radiates down the left side of the neck.  She denies fever, chills, drooling, trismus, difficulty swallowing, facial swelling, or muffled voice.  The history is provided by the patient. No language interpreter was used.  Dental Pain   This is a new problem. The current episode started more than 2 days ago. The problem occurs constantly. The problem has been rapidly worsening. The pain is at a severity of 10/10. The pain is severe. Treatments tried: BC Powder. The treatment provided mild relief.    History reviewed. No pertinent past medical history.  Patient Active Problem List   Diagnosis Date Noted  . Encounter for initial prescription of Nexplanon 09/19/2015  . OBESITY, NOS 04/29/2006  . 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 ACNE 04/29/2006    History reviewed. No pertinent surgical history.  OB History    Gravida Para Term Preterm AB Living   0 0 0 0 0 0   SAB TAB Ectopic Multiple Live Births   0 0 0 0         Home Medications    Prior to Admission medications   Medication Sig Start Date End Date Taking? Authorizing Provider  acetaminophen (TYLENOL) 325 MG tablet Take 2 tablets (650 mg total) by mouth every 6 (six) hours as needed. 03/22/16   Law, Waylan BogaAlexandra M, PA-C  fluconazole (DIFLUCAN) 200 MG tablet Take 1 tablet (200 mg total) by mouth once. Repeat dose in 48-72 hours. 09/19/15   Orvilla Cornwallenney, Rachelle A, CNM  ibuprofen (ADVIL,MOTRIN) 800 MG tablet Take 1 tablet (800 mg total) by mouth 3 (three) times daily. 03/22/16   Law, Waylan BogaAlexandra M, PA-C    metroNIDAZOLE (FLAGYL) 500 MG tablet Take 1 tablet (500 mg total) by mouth 2 (two) times daily. 09/19/15   Denney, Rachelle A, CNM  naproxen (NAPROSYN) 500 MG tablet Take 1 tablet (500 mg total) by mouth 2 (two) times daily. Patient not taking: Reported on 09/04/2015 08/24/14   Donnetta Hutchingook, Brian, MD  terconazole (TERAZOL 3) 0.8 % vaginal cream Place 1 applicator vaginally at bedtime. 09/19/15   Roe Coombsenney, Rachelle A, CNM    Family History No family history on file.  Social History Social History   Tobacco Use  . Smoking status: Current Every Day Smoker    Packs/day: 0.50    Types: Cigarettes  . Smokeless tobacco: Never Used  . Tobacco comment: 8 cig/day  Substance Use Topics  . Alcohol use: Yes    Alcohol/week: 0.0 oz    Comment: occ  . Drug use: No     Allergies   Patient has no known allergies.   Review of Systems Review of Systems  Constitutional: Negative for activity change, chills and fever.  HENT: Positive for dental problem. Negative for drooling, facial swelling, trouble swallowing and voice change.   Respiratory: Negative for shortness of breath.   Cardiovascular: Negative for chest pain.  Gastrointestinal: Negative for abdominal pain.  Musculoskeletal: Negative for back pain.  Skin: Negative for rash.     Physical Exam Updated Vital Signs  BP 125/64 (BP Location: Right Arm)   Pulse 72   Temp 98.1 F (36.7 C) (Oral)   Resp 16   SpO2 100%   Physical Exam  Constitutional: No distress.  HENT:  Head: Normocephalic.  Mouth/Throat: Uvula is midline and oropharynx is clear and moist. No trismus in the jaw. Dental caries present. No dental abscesses or uvula swelling.    TTP over tooth #17. There is an obvious dental cary.  No surrounding erythema, warmth, or edema to the surrounding gingiva.  Tooth 16 is nontender to palpation   Eyes: Conjunctivae are normal.  Neck: Neck supple.  Cardiovascular: Normal rate and regular rhythm. Exam reveals no gallop and no friction  rub.  No murmur heard. Pulmonary/Chest: Effort normal. No respiratory distress.  Abdominal: Soft. She exhibits no distension.  Neurological: She is alert.  Skin: Skin is warm. No rash noted.  Psychiatric: Her behavior is normal.  Nursing note and vitals reviewed.    ED Treatments / Results  Labs (all labs ordered are listed, but only abnormal results are displayed) Labs Reviewed - No data to display  EKG  EKG Interpretation None       Radiology No results found.  Procedures Dental Block Date/Time: 04/22/2017 1:16 PM Performed by: Barkley Boards, PA-C Authorized by: Barkley Boards, PA-C   Consent:    Consent obtained:  Verbal   Consent given by:  Patient   Risks discussed:  Infection, swelling, unsuccessful block, pain and intravascular injection   Alternatives discussed:  Delayed treatment and referral Indications:    Indications: dental pain   Location:    Block type:  Inferior alveolar   Laterality:  Left Procedure details (see MAR for exact dosages):    Syringe type:  Aspirating dental syringe   Needle gauge:  27 G   Anesthetic injected:  Bupivacaine 0.5% WITH epi   Injection procedure:  Anatomic landmarks identified, anatomic landmarks palpated, negative aspiration for blood, introduced needle and incremental injection Post-procedure details:    Outcome:  Anesthesia achieved   Patient tolerance of procedure:  Tolerated well, no immediate complications   (including critical care time)  Medications Ordered in ED Medications  bupivacaine-epinephrine (MARCAINE W/ EPI) 0.5% -1:200000 injection 1.8 mL (not administered)  oxyCODONE-acetaminophen (PERCOCET/ROXICET) 5-325 MG per tablet 1 tablet (1 tablet Oral Given 04/22/17 1249)     Initial Impression / Assessment and Plan / ED Course  I have reviewed the triage vital signs and the nursing notes.  Pertinent labs & imaging results that were available during my care of the patient were reviewed by me and  considered in my medical decision making (see chart for details).     Patient with toothache.  No gross abscess.  Inferior alveolar dental block successfully performed. Exam unconcerning for Ludwig's angina or spread of infection.  Will treat with anti-inflammatories.  Urged patient to follow-up with dentist.  Dental resource guide provided.   Final Clinical Impressions(s) / ED Diagnoses   Final diagnoses:  Pain, dental    ED Discharge Orders    None       Barkley Boards, PA-C 04/22/17 1317    Charlynne Pander, MD 04/22/17 1550

## 2017-04-22 NOTE — Discharge Instructions (Addendum)
The dental resource guide is attached.  Sometimes you must call all of the numbers on this paperwork before you can find someone who may be able to see you earlier.  I recommend calling as soon as possible as many dentist do not have office hours on Friday, Saturday, or Sunday.  Take 800 mg of ibuprofen with food every 8 hours as needed for pain.  Please avoid using Goody powder, Aleve, naproxen, Motrin or other NSAIDs while taking this medication as they are the same type of medication.  You can also alternate between thousand milligrams of Tylenol and ibuprofen and take 1 dose of ibuprofen then 4 hours later take a dose of Tylenol then repeat this regimen. you can also apply a cool compress to the left side of the face to help with pain and swelling.  Please avoid applying heat or a warm compress to the area because this can make swelling in your symptoms worse.  Swish and spit 15 mL of viscous lidocaine every 3 hours as needed for pain.  Please do not use this medication more than once every 3 hours  due to side effects.  If you develop new or worsening symptoms, including significant swelling to the face or neck, drooling, muffled voice, the inability to open her mouth, fever, chills, or other concerning symptoms, please return to the emergency department for reevaluation.

## 2017-07-01 ENCOUNTER — Other Ambulatory Visit: Payer: Self-pay

## 2017-07-01 ENCOUNTER — Ambulatory Visit (HOSPITAL_COMMUNITY)
Admission: EM | Admit: 2017-07-01 | Discharge: 2017-07-01 | Disposition: A | Discharge status: Home / Self Care | Payer: Self-pay | Attending: Family Medicine | Admitting: Family Medicine

## 2017-07-01 ENCOUNTER — Encounter (HOSPITAL_COMMUNITY): Payer: Self-pay | Admitting: Emergency Medicine

## 2017-07-01 DIAGNOSIS — F17210 Nicotine dependence, cigarettes, uncomplicated: Secondary | ICD-10-CM | POA: Insufficient documentation

## 2017-07-01 DIAGNOSIS — N390 Urinary tract infection, site not specified: Secondary | ICD-10-CM | POA: Insufficient documentation

## 2017-07-01 LAB — POCT PREGNANCY, URINE: Preg Test, Ur: NEGATIVE

## 2017-07-01 MED ORDER — NITROFURANTOIN MONOHYD MACRO 100 MG PO CAPS: 100.0000 mg | ORAL_CAPSULE | Freq: Two times a day (BID) | ORAL | 0 refills | Status: DC

## 2017-07-01 NOTE — ED Triage Notes (Signed)
Symptoms for 2 weeks.  Complains of burning with urination, abdominal pain, back pain, denies fever.

## 2017-07-01 NOTE — ED Provider Notes (Signed)
[facility]    CSN: [id number] Arrival date & time: 07/01/17  1740     History   Chief Complaint Chief Complaint  Patient presents with  . Urinary Tract Infection    HPI Morgan Morales is a 27 y.o. female.   Symptoms intermittently present for 2 weeks.  Complains of urinary frequency and dysuria.  Denies fever chills nausea vomiting or flank pain.  No history of chronic urinary tract infection.  HPI  No past medical history on file.  Patient Active Problem List   Diagnosis Date Noted  . Encounter for initial prescription of Nexplanon 09/19/2015  . OBESITY, NOS 04/29/2006  . 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 ACNE 04/29/2006    No past surgical history on file.  OB History    Gravida  0   Para  0   Term  0   Preterm  0   AB  0   Living  0     SAB  0   TAB  0   Ectopic  0   Multiple  0   Live Births               Home Medications    Prior to Admission medications   Medication Sig Start Date End Date Taking? Authorizing Provider  acetaminophen (TYLENOL) 325 MG tablet Take 2 tablets (650 mg total) by mouth every 6 (six) hours as needed. 03/22/16   Law, Waylan Boga, PA-C  fluconazole (DIFLUCAN) 200 MG tablet Take 1 tablet (200 mg total) by mouth once. Repeat dose in 48-72 hours. 09/19/15   Orvilla Cornwall A, CNM  ibuprofen (ADVIL,MOTRIN) 800 MG tablet Take 1 tablet (800 mg total) by mouth 3 (three) times daily. 04/22/17   McDonald, Mia A, PA-C  lidocaine (XYLOCAINE) 2 % solution Use as directed 15 mLs in the mouth or throat every 3 (three) hours as needed for mouth pain. 04/22/17   McDonald, Mia A, PA-C  metroNIDAZOLE (FLAGYL) 500 MG tablet Take 1 tablet (500 mg total) by mouth 2 (two) times daily. 09/19/15   Denney, Rachelle A, CNM  naproxen (NAPROSYN) 500 MG tablet Take 1 tablet (500 mg total) by mouth 2 (two) times daily. Patient not taking: Reported on 09/04/2015 08/24/14   Donnetta Hutching, MD  terconazole (TERAZOL 3) 0.8 % vaginal cream  Place 1 applicator vaginally at bedtime. 09/19/15   Roe Coombs, CNM    Family History No family history on file.  Social History Social History   Tobacco Use  . Smoking status: Current Every Day Smoker    Packs/day: 0.50    Types: Cigarettes  . Smokeless tobacco: Never Used  . Tobacco comment: 8 cig/day  Substance Use Topics  . Alcohol use: Yes    Alcohol/week: 0.0 oz    Comment: occ  . Drug use: No     Allergies   Patient has no known allergies.   Review of Systems Review of Systems  Constitutional: Negative.   Respiratory: Negative.   Cardiovascular: Negative.   Gastrointestinal: Negative.   Genitourinary: Positive for dysuria and frequency.     Physical Exam Triage Vital Signs ED Triage Vitals  Enc Vitals Group     BP      Pulse      Resp      Temp      Temp src      SpO2      Weight      Height      Head Circumference  Peak Flow      Pain Score      Pain Loc      Pain Edu?      Excl. in GC?    No data found.  Updated Vital Signs There were no vitals taken for this visit.  Visual Acuity Right Eye Distance:   Left Eye Distance:   Bilateral Distance:    Right Eye Near:   Left Eye Near:    Bilateral Near:     Physical Exam  Constitutional: She appears well-developed and well-nourished.  Cardiovascular: Normal rate and regular rhythm.  Pulmonary/Chest: Effort normal and breath sounds normal.  Abdominal: Soft. There is no tenderness.     UC Treatments / Results  Labs (all labs ordered are listed, but only abnormal results are displayed) Labs Reviewed - No data to display  EKG None  Radiology No results found.  Procedures Procedures (including critical care time)  Medications Ordered in UC Medications - No data to display  Initial Impression / Assessment and Plan / UC Course  I have reviewed the triage vital signs and the nursing notes.  Pertinent labs & imaging results that were available during my care of the  patient were reviewed by me and considered in my medical decision making (see chart for details).  Clinical Course as of Jul 01 1840  Thu Jul 01, 2017  1836 POCT Urinalysis, Dipstick [SM]    Clinical Course User Index [SM] Frederica Kuster, MD    Urinary tract infection.  Likely E. coli is the offending bacteria but will do culture based on dipstick urinalysis.  Begin Macrobid pending results of culture and drink plenty of fluids Final Clinical Impressions(s) / UC Diagnoses   Final diagnoses:  None   Discharge Instructions   None    ED Prescriptions    None     Controlled Substance Prescriptions [place] Controlled Substance Registry consulted? No   Frederica Kuster, MD 07/01/17 [phone]

## 2017-07-01 NOTE — ED Notes (Signed)
Patient discharged by provider.

## 2017-07-01 NOTE — ED Notes (Signed)
Urinalysis exceeded the limits of the Clinitex capabilities, requested a urine culture

## 2017-07-03 LAB — URINE CULTURE: Culture: >=100000 — AB

## 2017-07-05 NOTE — Progress Notes (Signed)
Urine culture positive for E.Coli, this was treated with Macrobid at urgent care visit.

## 2018-09-20 ENCOUNTER — Other Ambulatory Visit: Payer: Self-pay

## 2018-09-20 ENCOUNTER — Ambulatory Visit (HOSPITAL_COMMUNITY)
Admission: EM | Admit: 2018-09-20 | Discharge: 2018-09-20 | Disposition: A | Discharge status: Home / Self Care | Payer: Self-pay | Attending: Family Medicine | Admitting: Family Medicine

## 2018-09-20 ENCOUNTER — Encounter (HOSPITAL_COMMUNITY): Payer: Self-pay | Admitting: Emergency Medicine

## 2018-09-20 DIAGNOSIS — N898 Other specified noninflammatory disorders of vagina: Secondary | ICD-10-CM | POA: Insufficient documentation

## 2018-09-20 LAB — POCT PREGNANCY, URINE: Preg Test, Ur: NEGATIVE

## 2018-09-20 LAB — POCT URINALYSIS DIP (DEVICE)
Bilirubin Urine: NEGATIVE
Glucose, UA: NEGATIVE mg/dL
Hgb urine dipstick: NEGATIVE
Ketones, ur: NEGATIVE mg/dL
Leukocytes,Ua: NEGATIVE
Nitrite: NEGATIVE
Protein, ur: NEGATIVE mg/dL
Specific Gravity, Urine: 1.025 (ref 1.005–1.030)
Urobilinogen, UA: 0.2 mg/dL (ref 0.0–1.0)
pH: 5.5 (ref 5.0–8.0)

## 2018-09-20 MED ORDER — METRONIDAZOLE 500 MG PO TABS: 500.0000 mg | ORAL_TABLET | Freq: Two times a day (BID) | ORAL | 0 refills | Status: AC

## 2018-09-20 NOTE — ED Provider Notes (Signed)
[facility]    CSN: [id number] Arrival date & time: 09/20/18  1346      History   Chief Complaint Chief Complaint  Patient presents with  . Vaginal Discharge    HPI Morgan Morales is a 28 y.o. female no significant past medical history presenting today for evaluation of vaginal discharge.  Patient has had vaginal discharge for the past 2 to 3 weeks.  Patient states that her symptoms started after unprotected intercourse.  She states that it feels as if her pH balance is off and feels similar to when she has had BV in the past.  She denies any itching or irritation.  Denies urinary symptoms of dysuria, increased frequency or hematuria.  She has occasionally felt some mild lower abdominal pain and some occasional blood with wiping.  Patient is on Nexplanon, does not have regular cycles.  Declines spotting.  She would also like to be checked for STDs.  Denies fevers nausea or vomiting.  HPI  History reviewed. No pertinent past medical history.  Patient Active Problem List   Diagnosis Date Noted  . Lower urinary tract infectious disease 07/01/2017  . Encounter for initial prescription of Nexplanon 09/19/2015  . OBESITY, NOS 04/29/2006  . 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 ACNE 04/29/2006    History reviewed. No pertinent surgical history.  OB History    Gravida  0   Para  0   Term  0   Preterm  0   AB  0   Living  0     SAB  0   TAB  0   Ectopic  0   Multiple  0   Live Births               Home Medications    Prior to Admission medications   Medication Sig Start Date End Date Taking? Authorizing Provider  acetaminophen (TYLENOL) 325 MG tablet Take 2 tablets (650 mg total) by mouth every 6 (six) hours as needed. 03/22/16   Law, Bea Graff, PA-C  fluconazole (DIFLUCAN) 200 MG tablet Take 1 tablet (200 mg total) by mouth once. Repeat dose in 48-72 hours. 09/19/15   Kandis Cocking A, CNM  ibuprofen (ADVIL,MOTRIN) 800 MG tablet Take 1  tablet (800 mg total) by mouth 3 (three) times daily. 04/22/17   McDonald, Mia A, PA-C  lidocaine (XYLOCAINE) 2 % solution Use as directed 15 mLs in the mouth or throat every 3 (three) hours as needed for mouth pain. 04/22/17   McDonald, Mia A, PA-C  metroNIDAZOLE (FLAGYL) 500 MG tablet Take 1 tablet (500 mg total) by mouth 2 (two) times daily for 7 days. 09/20/18 09/27/18  Aarna Mihalko C, PA-C  naproxen (NAPROSYN) 500 MG tablet Take 1 tablet (500 mg total) by mouth 2 (two) times daily. Patient not taking: Reported on 09/04/2015 08/24/14   Nat Christen, MD  nitrofurantoin, macrocrystal-monohydrate, (MACROBID) 100 MG capsule Take 1 capsule (100 mg total) by mouth 2 (two) times daily. 07/01/17   Wardell Honour, MD  Phenazopyridine HCl (AZO-STANDARD PO) Take by mouth.    [provider]  terconazole (TERAZOL 3) 0.8 % vaginal cream Place 1 applicator vaginally at bedtime. 09/19/15   Morene Crocker, CNM    Family History History reviewed. No pertinent family history.  Social History Social History   Tobacco Use  . Smoking status: Current Every Day Smoker    Packs/day: 0.50    Types: Cigarettes  . Smokeless tobacco: Never Used  .  Tobacco comment: 8 cig/day  Substance Use Topics  . Alcohol use: Yes    Alcohol/week: 0.0 standard drinks    Comment: occ  . Drug use: No     Allergies   Patient has no known allergies.   Review of Systems Review of Systems  Constitutional: Negative for fever.  Respiratory: Negative for shortness of breath.   Cardiovascular: Negative for chest pain.  Gastrointestinal: Negative for abdominal pain, diarrhea, nausea and vomiting.  Genitourinary: Positive for vaginal discharge. Negative for dysuria, flank pain, genital sores, hematuria, menstrual problem, vaginal bleeding and vaginal pain.  Musculoskeletal: Negative for back pain.  Skin: Negative for rash.  Neurological: Negative for dizziness, light-headedness and headaches.     Physical Exam  Triage Vital Signs ED Triage Vitals  Enc Vitals Group     BP 09/20/18 1359 125/86     Pulse Rate 09/20/18 1359 77     Resp 09/20/18 1359 14     Temp 09/20/18 1359 99.3 F (37.4 C)     Temp Source 09/20/18 1359 Oral     SpO2 09/20/18 1359 98 %     Weight --      Height --      Head Circumference --      Peak Flow --      Pain Score 09/20/18 1357 0     Pain Loc --      Pain Edu? --      Excl. in GC? --    No data found.  Updated Vital Signs BP 125/86 (BP Location: Right Arm)   Pulse 77   Temp 99.3 F (37.4 C) (Oral)   Resp 14   SpO2 98%   Visual Acuity Right Eye Distance:   Left Eye Distance:   Bilateral Distance:    Right Eye Near:   Left Eye Near:    Bilateral Near:     Physical Exam Vitals signs and nursing note reviewed.  Constitutional:      General: She is not in acute distress.    Appearance: She is well-developed.  HENT:     Head: Normocephalic and atraumatic.  Eyes:     Conjunctiva/sclera: Conjunctivae normal.  Neck:     Musculoskeletal: Neck supple.  Cardiovascular:     Rate and Rhythm: Normal rate and regular rhythm.     Heart sounds: No murmur.  Pulmonary:     Effort: Pulmonary effort is normal. No respiratory distress.     Breath sounds: Normal breath sounds.  Abdominal:     Palpations: Abdomen is soft.     Tenderness: There is no abdominal tenderness.     Comments: Soft, nondistended, nontender to light and deep palpation throughout all 4 quadrants of abdomen and suprapubic area  Genitourinary:    Comments: deferred Skin:    General: Skin is warm and dry.  Neurological:     Mental Status: She is alert.      UC Treatments / Results  Labs (all labs ordered are listed, but only abnormal results are displayed) Labs Reviewed  POC URINE PREG, ED  POCT URINALYSIS DIP (DEVICE)  CERVICOVAGINAL ANCILLARY ONLY    EKG   Radiology No results found.  Procedures Procedures (including critical care time)  Medications Ordered in UC  Medications - No data to display  Initial Impression / Assessment and Plan / UC Course  I have reviewed the triage vital signs and the nursing notes.  Pertinent labs & imaging results that were available during my care of the  patient were reviewed by me and considered in my medical decision making (see chart for details).     Pregnancy test negative History of BV, similar symptoms in past, will empirically treat for this today. Swab obtained to check for STD's/causes of discharge. Will call with results and alter treatment if needed. Discussed strict return precautions. Patient verbalized understanding and is agreeable with plan.  Final Clinical Impressions(s) / UC Diagnoses   Final diagnoses:  Vaginal discharge     Discharge Instructions     May begin metronidazole twice daily for 1 week for BV. No alcohol while taking.   We are testing you for Gonorrhea, Chlamydia, Trichomonas, Yeast and Bacterial Vaginosis. We will call you if anything is positive and let you know if you require any further treatment. Please inform partners of any positive results.   Please return if symptoms not improving with treatment, development of fever, nausea, vomiting, abdominal pain.     ED Prescriptions    Medication Sig Dispense Auth. Provider   metroNIDAZOLE (FLAGYL) 500 MG tablet Take 1 tablet (500 mg total) by mouth 2 (two) times daily for 7 days. 14 tablet Phylicia Mcgaugh, SutherlandHallie C, PA-C     Controlled Substance Prescriptions [place] Controlled Substance Registry consulted? Not Applicable   Lew DawesWieters, Yaron Grasse C, [place]PA-C 09/20/18 1421

## 2018-09-20 NOTE — Discharge Instructions (Signed)
May begin metronidazole twice daily for 1 week for BV. No alcohol while taking.   We are testing you for Gonorrhea, Chlamydia, Trichomonas, Yeast and Bacterial Vaginosis. We will call you if anything is positive and let you know if you require any further treatment. Please inform partners of any positive results.   Please return if symptoms not improving with treatment, development of fever, nausea, vomiting, abdominal pain.

## 2018-09-20 NOTE — ED Triage Notes (Signed)
Pt reports vaginal discharge x 2-3 weeks. She did have a few times where she had some lower abdominal pain and some mild bloody discharge.  She only has the discharge at this time, with no other issues.

## 2018-09-22 LAB — CERVICOVAGINAL ANCILLARY ONLY
Bacterial vaginitis: POSITIVE — AB
Candida vaginitis: NEGATIVE
Chlamydia: NEGATIVE
Neisseria Gonorrhea: NEGATIVE
Trichomonas: NEGATIVE

## 2019-06-09 ENCOUNTER — Encounter (HOSPITAL_COMMUNITY): Payer: Self-pay

## 2019-06-09 ENCOUNTER — Other Ambulatory Visit: Payer: Self-pay

## 2019-06-09 ENCOUNTER — Ambulatory Visit (HOSPITAL_COMMUNITY)
Admission: EM | Admit: 2019-06-09 | Discharge: 2019-06-09 | Disposition: A | Discharge status: Home / Self Care | Payer: Medicaid Other | Attending: Family Medicine | Admitting: Family Medicine

## 2019-06-09 DIAGNOSIS — N739 Female pelvic inflammatory disease, unspecified: Secondary | ICD-10-CM

## 2019-06-09 DIAGNOSIS — Z3202 Encounter for pregnancy test, result negative: Secondary | ICD-10-CM

## 2019-06-09 LAB — POCT URINALYSIS DIP (DEVICE)
Bilirubin Urine: NEGATIVE
Glucose, UA: NEGATIVE mg/dL
Hgb urine dipstick: NEGATIVE
Ketones, ur: NEGATIVE mg/dL
Leukocytes,Ua: NEGATIVE
Nitrite: NEGATIVE
Protein, ur: NEGATIVE mg/dL
Specific Gravity, Urine: >=1.03 (ref 1.005–1.030)
Urobilinogen, UA: 0.2 mg/dL (ref 0.0–1.0)
pH: 5.5 (ref 5.0–8.0)

## 2019-06-09 LAB — POC URINE PREG, ED: Preg Test, Ur: NEGATIVE

## 2019-06-09 LAB — POCT PREGNANCY, URINE: Preg Test, Ur: NEGATIVE

## 2019-06-09 MED ORDER — CEFTRIAXONE SODIUM 500 MG IJ SOLR
INTRAMUSCULAR | Status: AC
  Filled 2019-06-09: qty 500

## 2019-06-09 MED ORDER — CEFTRIAXONE SODIUM 500 MG IJ SOLR
500.0000 mg | Freq: Once | INTRAMUSCULAR | Status: AC
  Administered 2019-06-09: 13:00:00 500 mg via INTRAMUSCULAR

## 2019-06-09 MED ORDER — LIDOCAINE HCL (PF) 1 % IJ SOLN
INTRAMUSCULAR | Status: AC
  Filled 2019-06-09: qty 2

## 2019-06-09 MED ORDER — DOXYCYCLINE HYCLATE 100 MG PO CAPS: 100.0000 mg | ORAL_CAPSULE | Freq: Two times a day (BID) | ORAL | 0 refills | Status: DC

## 2019-06-09 MED ORDER — METRONIDAZOLE 500 MG PO TABS: 500.0000 mg | ORAL_TABLET | Freq: Two times a day (BID) | ORAL | 0 refills | Status: DC

## 2019-06-09 NOTE — Discharge Instructions (Signed)
We are treating you for Pelvic inflammatory disease Take both the antibiotics as prescribed Ibuprofen for pain as needed.  Follow up as needed for continued or worsening symptoms

## 2019-06-09 NOTE — ED Triage Notes (Signed)
Pt c/o 10/10 sharp pelvic pain, small amount white discharge. Pt states she believes she has BV again. Pt wants a pregnancy test.

## 2019-06-10 NOTE — ED Provider Notes (Signed)
[facility]    CSN: [id number] Arrival date & time: 06/09/19  1058      History   Chief Complaint Chief Complaint  Patient presents with  . Abdominal Pain    HPI Morgan Morales is a 29 y.o. female.   Patient is a 29 year old female presents today with pelvic pain, white discharge.  Describes discharge as white, milky and mild odor.  Consistent with previous BV that she has had in the past.  There is also some mild concern for STDs.  Patient has implant for birth control but is requesting pregnancy test.  Mild fever here today.  No nausea, vomiting, dysuria, hematuria or urinary frequency.  ROS per HPI      History reviewed. No pertinent past medical history.  Patient Active Problem List   Diagnosis Date Noted  . Lower urinary tract infectious disease 07/01/2017  . Encounter for initial prescription of Nexplanon 09/19/2015  . OBESITY, NOS 04/29/2006  . 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 ACNE 04/29/2006    History reviewed. No pertinent surgical history.  OB History    Gravida  0   Para  0   Term  0   Preterm  0   AB  0   Living  0     SAB  0   TAB  0   Ectopic  0   Multiple  0   Live Births               Home Medications    Prior to Admission medications   Medication Sig Start Date End Date Taking? Authorizing Provider  acetaminophen (TYLENOL) 325 MG tablet Take 2 tablets (650 mg total) by mouth every 6 (six) hours as needed. 03/22/16   Law, Waylan Boga, PA-C  doxycycline (VIBRAMYCIN) 100 MG capsule Take 1 capsule (100 mg total) by mouth 2 (two) times daily. 06/09/19   Dahlia Byes A, NP  ibuprofen (ADVIL,MOTRIN) 800 MG tablet Take 1 tablet (800 mg total) by mouth 3 (three) times daily. 04/22/17   McDonald, Mia A, PA-C  metroNIDAZOLE (FLAGYL) 500 MG tablet Take 1 tablet (500 mg total) by mouth 2 (two) times daily. 06/09/19   Janace Aris, NP    Family History Family History  Problem Relation Age of Onset  . Hypertension  Mother   . Diabetes Mother     Social History Social History   Tobacco Use  . Smoking status: Current Every Day Smoker    Packs/day: 0.50    Types: Cigarettes  . Smokeless tobacco: Never Used  . Tobacco comment: 8 cig/day  Substance Use Topics  . Alcohol use: Yes    Alcohol/week: 0.0 standard drinks    Comment: occ  . Drug use: No     Allergies   Patient has no known allergies.   Review of Systems Review of Systems   Physical Exam Triage Vital Signs ED Triage Vitals  Enc Vitals Group     BP 06/09/19 1140 120/79     Pulse Rate 06/09/19 1140 66     Resp 06/09/19 1140 16     Temp 06/09/19 1140 99.7 F (37.6 C)     Temp Source 06/09/19 1140 Oral     SpO2 --      Weight 06/09/19 1149 202 lb (91.6 kg)     Height 06/09/19 1149 5\' 4"  (1.626 m)     Head Circumference --      Peak Flow --      Pain Score  06/09/19 1149 10     Pain Loc --      Pain Edu? --      Excl. in GC? --    No data found.  Updated Vital Signs BP 120/79 (BP Location: Right Arm)   Pulse 66   Temp 99.7 F (37.6 C) (Oral)   Resp 16   Ht 5\' 4"  (1.626 m)   Wt 202 lb (91.6 kg)   BMI 34.67 kg/m   Visual Acuity Right Eye Distance:   Left Eye Distance:   Bilateral Distance:    Right Eye Near:   Left Eye Near:    Bilateral Near:     Physical Exam Vitals and nursing note reviewed.  Constitutional:      General: She is not in acute distress.    Appearance: Normal appearance. She is not ill-appearing, toxic-appearing or diaphoretic.  HENT:     Head: Normocephalic.     Nose: Nose normal.     Mouth/Throat:     Pharynx: Oropharynx is clear.  Eyes:     Conjunctiva/sclera: Conjunctivae normal.  Pulmonary:     Effort: Pulmonary effort is normal.  Abdominal:     General: Bowel sounds are normal.     Palpations: Abdomen is soft.     Tenderness: There is abdominal tenderness in the right lower quadrant, suprapubic area and left lower quadrant. There is no right CVA tenderness or left CVA  tenderness.  Genitourinary:    Comments: Pt refused pelvic exam  Musculoskeletal:        General: Normal range of motion.     Cervical back: Normal range of motion.  Skin:    General: Skin is warm and dry.     Findings: No rash.  Neurological:     Mental Status: She is alert.  Psychiatric:        Mood and Affect: Mood normal.      UC Treatments / Results  Labs (all labs ordered are listed, but only abnormal results are displayed) Labs Reviewed  POCT URINALYSIS DIP (DEVICE)  POCT PREGNANCY, URINE  POC URINE PREG, ED    EKG   Radiology No results found.  Procedures Procedures (including critical care time)  Medications Ordered in UC Medications  cefTRIAXone (ROCEPHIN) injection 500 mg (500 mg Intramuscular Given 06/09/19 1236)    Initial Impression / Assessment and Plan / UC Course  I have reviewed the triage vital signs and the nursing notes.  Pertinent labs & imaging results that were available during my care of the patient were reviewed by me and considered in my medical decision making (see chart for details).     PID-treating for PID based on symptoms, exam and concern for STDs.  She has had PID in the past.  Patient has refused her pelvic exam today. Treating prophylactically for gonorrhea, chlamydia and bacterial vaginosis Swab sent for testing with labs pending. Pregnancy test negative Follow up as needed for continued or worsening symptoms  Final Clinical Impressions(s) / UC Diagnoses   Final diagnoses:  PID (pelvic inflammatory disease)     Discharge Instructions     We are treating you for Pelvic inflammatory disease Take both the antibiotics as prescribed Ibuprofen for pain as needed.  Follow up as needed for continued or worsening symptoms     ED Prescriptions    Medication Sig Dispense Auth. Provider   doxycycline (VIBRAMYCIN) 100 MG capsule Take 1 capsule (100 mg total) by mouth 2 (two) times daily. 20 capsule [place], [place]  A, NP    metroNIDAZOLE (FLAGYL) 500 MG tablet Take 1 tablet (500 mg total) by mouth 2 (two) times daily. 14 tablet Cobi Aldape A, NP     PDMP not reviewed this encounter.   Loura Halt A, NP 06/10/19 1013

## 2019-06-15 ENCOUNTER — Other Ambulatory Visit: Payer: Self-pay

## 2019-06-15 ENCOUNTER — Ambulatory Visit: Payer: Self-pay | Attending: Internal Medicine | Admitting: Internal Medicine

## 2020-01-02 ENCOUNTER — Ambulatory Visit: Payer: Self-pay

## 2020-04-18 ENCOUNTER — Encounter (HOSPITAL_COMMUNITY): Payer: Self-pay

## 2022-04-28 ENCOUNTER — Encounter (HOSPITAL_COMMUNITY): Payer: Self-pay | Admitting: Emergency Medicine

## 2022-04-28 ENCOUNTER — Emergency Department (HOSPITAL_COMMUNITY)
Admission: EM | Admit: 2022-04-28 | Discharge: 2022-04-28 | Disposition: A | Discharge status: Home / Self Care | Payer: Medicaid Other | Attending: Emergency Medicine | Admitting: Emergency Medicine

## 2022-04-28 ENCOUNTER — Emergency Department (HOSPITAL_COMMUNITY): Payer: Medicaid Other

## 2022-04-28 ENCOUNTER — Other Ambulatory Visit: Payer: Self-pay

## 2022-04-28 DIAGNOSIS — M654 Radial styloid tenosynovitis [de Quervain]: Secondary | ICD-10-CM | POA: Diagnosis not present

## 2022-04-28 DIAGNOSIS — M7989 Other specified soft tissue disorders: Secondary | ICD-10-CM | POA: Diagnosis not present

## 2022-04-28 DIAGNOSIS — M25531 Pain in right wrist: Secondary | ICD-10-CM | POA: Diagnosis not present

## 2022-04-28 MED ORDER — GABAPENTIN 100 MG PO CAPS
100.0000 mg | ORAL_CAPSULE | Freq: Once | ORAL | Status: AC
  Administered 2022-04-28: 100 mg via ORAL
  Filled 2022-04-28: qty 1

## 2022-04-28 MED ORDER — NAPROXEN 500 MG PO TABS: 500.0000 mg | ORAL_TABLET | Freq: Two times a day (BID) | ORAL | 0 refills | Status: DC

## 2022-04-28 MED ORDER — KETOROLAC TROMETHAMINE 60 MG/2ML IM SOLN
60.0000 mg | Freq: Once | INTRAMUSCULAR | Status: AC
  Administered 2022-04-28: 60 mg via INTRAMUSCULAR
  Filled 2022-04-28: qty 2

## 2022-04-28 MED ORDER — GABAPENTIN 100 MG PO CAPS: 100.0000 mg | ORAL_CAPSULE | Freq: Three times a day (TID) | ORAL | 0 refills | Status: DC | PRN

## 2022-04-28 NOTE — ED Triage Notes (Signed)
Pt indicated that two weeks ago she woke up to rght wrist pain that moves into her hand.  There is swelling to the wrist and hand but good pulses, temp and color.  Pt is able to move wrist and fingers.  Pt instructed to remove rings and was able to get all but one off. Currently holding ice to area.

## 2022-04-28 NOTE — Progress Notes (Signed)
Orthopedic Tech Progress Note Patient Details:  ALIZ MUSGRAVE 1990/03/07 [id number]  Ortho Devices Type of Ortho Device: Velcro wrist splint Ortho Device/Splint Location: rue Ortho Device/Splint Interventions: Ordered, Application, Adjustment   Post Interventions Patient Tolerated: Well Instructions Provided: Care of device, Adjustment of device  Karolee Stamps 04/28/2022, 10:55 PM

## 2022-04-28 NOTE — ED Provider Notes (Signed)
[facility] Provider Note   CSN: [id number] Arrival date & time: 04/28/22  2110     History  Chief Complaint  Patient presents with   Wrist Pain    Morgan Morales is a 32 y.o. female.  Otherwise healthy with PMH of obesity, right-hand-dominant who presents with ongoing worsening right wrist pain atraumatic in nature.  She has had episodes of pain in her wrist shooting down her hand and up into her elbow with movement for the past couple of weeks.  Is worse when using her wrist.  She noticed some swelling in her right wrist that improved with icing at home.  She has been taking some ibuprofen and gabapentin with improvement.  She has had no recent falls or injuries.  No history of gout or pseudogout.  No fevers or illness.  She is able to move all of her fingers completely and make a handgrip but feels at times that it gets tight or tense.  She also notes some numbness in all of her fingers however when I am touching her she can still feel me all throughout.  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   Wrist Pain       Home Medications Prior to Admission medications   Medication Sig Start Date End Date Taking? Authorizing Provider  gabapentin (NEURONTIN) 100 MG capsule Take 1 capsule (100 mg total) by mouth 3 (three) times daily as needed. 04/28/22  Yes Elgie Congo, MD  naproxen (NAPROSYN) 500 MG tablet Take 1 tablet (500 mg total) by mouth 2 (two) times daily. 04/28/22  Yes Elgie Congo, MD  acetaminophen (TYLENOL) 325 MG tablet Take 2 tablets (650 mg total) by mouth every 6 (six) hours as needed. 03/22/16   Law, Bea Graff, PA-C  doxycycline (VIBRAMYCIN) 100 MG capsule Take 1 capsule (100 mg total) by mouth 2 (two) times daily. 06/09/19   Loura Halt A, NP  ibuprofen (ADVIL,MOTRIN) 800 MG tablet Take 1 tablet (800 mg total) by mouth 3 (three) times daily.  04/22/17   McDonald, Mia A, PA-C  metroNIDAZOLE (FLAGYL) 500 MG tablet Take 1 tablet (500 mg total) by mouth 2 (two) times daily. 06/09/19   Orvan July, NP      Allergies    Patient has no known allergies.    Review of Systems   Review of Systems  Physical Exam Updated Vital Signs BP 127/85 (BP Location: Right Arm)   Pulse (!) 108   Temp 98 F (36.7 C) (Oral)   Resp 19   SpO2 100%  Physical Exam Constitutional: Alert and oriented. Well appearing and in no distress. Eyes: Conjunctivae are normal. Cardiovascular: S1, S2, regular rate and rhythm, equal palpable radial pulses, normal and symmetric distal pulses are present in all extremities.Warm and well perfused. Respiratory: Normal respiratory effort.  Gastrointestinal: Nondistended Musculoskeletal: Tenderness of right wrist along the radial aspect of the wrist.  There is no associated erythema, warmth or swelling.  Full range of motion of wrist intact.  Full strength of wrist intact.  Full grip strength intact.  Palpable radial pulses capillary refill less than 2 seconds.Positive Finkelsteins test.  She had negative Phalen test and Tinel test, no muscle atrophy present.  Full grip strength intact.  Sensation grossly intact throughout all digits. Okay sign intact, scissors fingers, interosseous mm intact all fingers. Neurologic: Normal speech and language. No gross focal  neurologic deficits are appreciated. Skin: Skin is warm, dry and intact. No rash noted. Psychiatric: Mood and affect are normal. Speech and behavior are normal.  ED Results / Procedures / Treatments   Labs (all labs ordered are listed, but only abnormal results are displayed) Labs Reviewed - No data to display  EKG None  Radiology DG Wrist Complete Right  Result Date: 04/28/2022 CLINICAL DATA:  Pain and swelling. EXAM: RIGHT WRIST - COMPLETE 3+ VIEW COMPARISON:  None Available. FINDINGS: There is no evidence of fracture or dislocation. There is no evidence of  arthropathy or other focal bone abnormality. Soft tissues are unremarkable. IMPRESSION: Negative. Electronically Signed   By: Brett Fairy M.D.   On: 04/28/2022 22:22    Procedures Procedures    Medications Ordered in ED Medications  ketorolac (TORADOL) injection 60 mg (60 mg Intramuscular Given 04/28/22 2241)  gabapentin (NEURONTIN) capsule 100 mg (100 mg Oral Given 04/28/22 2241)    ED Course/ Medical Decision Making/ A&P                            Medical Decision Making NORMAL KUZMICH is a 32 y.o. female.  Otherwise healthy with PMH of obesity, right-hand-dominant who presents with ongoing worsening right wrist pain atraumatic in nature.    Patient's wrist exam most consistent with De Quervain tenosynovitis.  She had positive Finkelsteins test.  She had negative Phalen test and Tinel test, so no findings suggestive of Carpal Tunnel. She is neurovascularly intact.  There is no concern for ischemic hand or limb.  An x-ray obtained of the wrist which I personally reviewed no evidence of fracture or dislocation.  She has no fluid no erythema no warmth and no infectious symptoms, no concern for joint infection.  Provided patient with wrist splint and prescriptions for as needed naproxen and gabapentin.  Provided with information for supportive care and hand surgery information for follow-up.  Return precautions discussed.  Amount and/or Complexity of Data Reviewed Radiology: ordered.  Risk Prescription drug management.    Final Clinical Impression(s) / ED Diagnoses Final diagnoses:  Right wrist pain  De Quervain's disease (tenosynovitis)    Rx / DC Orders ED Discharge Orders          Ordered    naproxen (NAPROSYN) 500 MG tablet  2 times daily        04/28/22 2237    gabapentin (NEURONTIN) 100 MG capsule  3 times daily PRN        04/28/22 2237              Elgie Congo, MD 04/28/22 2249

## 2022-04-28 NOTE — Discharge Instructions (Addendum)
You were seen for wrist pain.  Your x-ray was reassuring and showed no fracture or dislocation.  See the attached information regarding your wrist pain.  Take the naproxen as prescribed.  You can also take gabapentin as needed for pain.  Continue to use the wrist brace that we have provided you today.  If pain is worsening or not improving over the next 2 weeks despite using the brace and using the medicines you can call the orthopedic hand surgeon listed in your discharge paperwork.  Come back if any severe uncontrolled pain, increased swelling, inability to use your hand, or any other symptoms concerning to you.

## 2022-04-30 ENCOUNTER — Telehealth: Payer: Self-pay | Admitting: Obstetrics and Gynecology

## 2022-04-30 NOTE — Transitions of Care (Post Inpatient/ED Visit) (Signed)
   04/30/2022  Name: Morgan Morales MRN: [id number] DOB: 01-Aug-1990  Today's TOC FU Call Status: Today's TOC FU Call Status:: Unsuccessul Call (1st Attempt) Unsuccessful Call (1st Attempt) Date: 04/30/22  Attempted to reach the patient regarding the most recent Inpatient/ED visit.  Follow Up Plan: Additional outreach attempts will be made to reach the patient to complete the Transitions of Care (Post Inpatient/ED visit) call.   Aida Raider RN, BSN [place]  [facility] - Managed Medicaid High Risk [phone]

## 2022-06-29 ENCOUNTER — Ambulatory Visit (INDEPENDENT_AMBULATORY_CARE_PROVIDER_SITE_OTHER): Payer: Medicaid Other | Admitting: Primary Care

## 2022-12-03 ENCOUNTER — Telehealth: Payer: Medicaid Other | Admitting: Nurse Practitioner

## 2022-12-03 ENCOUNTER — Encounter (INDEPENDENT_AMBULATORY_CARE_PROVIDER_SITE_OTHER): Payer: Self-pay

## 2022-12-03 ENCOUNTER — Telehealth: Payer: Medicaid Other | Admitting: Physician Assistant

## 2022-12-03 DIAGNOSIS — F4321 Adjustment disorder with depressed mood: Secondary | ICD-10-CM

## 2022-12-03 DIAGNOSIS — Z91199 Patient's noncompliance with other medical treatment and regimen due to unspecified reason: Secondary | ICD-10-CM

## 2022-12-03 NOTE — Progress Notes (Signed)
The patient no-showed for appointment despite this provider sending direct link, reaching out via phone with no response and waiting for at least 10 minutes from appointment time for patient to join. They will be marked as a NS for this appointment/time.   Mar Daring, PA-C

## 2022-12-03 NOTE — Progress Notes (Signed)
Virtual Visit Consent   Morgan Morales, you are scheduled for a virtual visit with a [facility] provider today. Just as with appointments in the office, your consent must be obtained to participate. Your consent will be active for this visit and any virtual visit you may have with one of our providers in the next 365 days. If you have a MyChart account, a copy of this consent can be sent to you electronically.  As this is a virtual visit, video technology does not allow for your provider to perform a traditional examination. This may limit your provider's ability to fully assess your condition. If your provider identifies any concerns that need to be evaluated in person or the need to arrange testing (such as labs, EKG, etc.), we will make arrangements to do so. Although advances in technology are sophisticated, we cannot ensure that it will always work on either your end or our end. If the connection with a video visit is poor, the visit may have to be switched to a telephone visit. With either a video or telephone visit, we are not always able to ensure that we have a secure connection.  By engaging in this virtual visit, you consent to the provision of healthcare and authorize for your insurance to be billed (if applicable) for the services provided during this visit. Depending on your insurance coverage, you may receive a charge related to this service.  I need to obtain your verbal consent now. Are you willing to proceed with your visit today? Morgan Morales has provided verbal consent on 12/03/2022 for a virtual visit (video or telephone). Viviano Simas, FNP  Date: 12/03/2022 5:15 PM  Virtual Visit via Video Note   I, Viviano Simas, connected with  Morgan Morales  ([id number], 12-06-1990) on 12/03/22 at  5:15 PM EDT by a video-enabled telemedicine application and verified that I am speaking with the correct person using two identifiers.  Location: Patient: Virtual Visit Location  Patient: Home Provider: Virtual Visit Location Provider: Home Office   I discussed the limitations of evaluation and management by telemedicine and the availability of in person appointments. The patient expressed understanding and agreed to proceed.    History of Present Illness: Morgan Morales is a 32 y.o. who identifies as a female who was assigned female at birth, and is being seen today for some situational depression that has centered around the passing of her mother.   She has been feeling down, not wanting to be around other people  Denies SI/HI She has been experiencing fatigue  She has tried to go through [facility] for counseling and intake process but did not make it to the point of seeing a provider  She has not been on medicine for depression in the past   She is scheduled to see Gwinda Passe 12/23/22 to establish care with a new PCP since moving back to [facility] score=18  Feels she just really wants to talk to someone, feeling helpless No thoughts of self harm,    Problems:  Patient Active Problem List   Diagnosis Date Noted   Lower urinary tract infectious disease 07/01/2017   Encounter for initial prescription of Nexplanon 09/19/2015   OBESITY, NOS 04/29/2006   MENSTRUATION, PAINFUL 04/29/2006   ACNE 04/29/2006    Allergies: No Known Allergies Medications:  Current Outpatient Medications:    acetaminophen (TYLENOL) 325 MG tablet, Take 2 tablets (650 mg total) by mouth every 6 (six) hours as needed., Disp:  30 tablet, Rfl: 0   doxycycline (VIBRAMYCIN) 100 MG capsule, Take 1 capsule (100 mg total) by mouth 2 (two) times daily., Disp: 20 capsule, Rfl: 0   gabapentin (NEURONTIN) 100 MG capsule, Take 1 capsule (100 mg total) by mouth 3 (three) times daily as needed., Disp: 60 capsule, Rfl: 0   ibuprofen (ADVIL,MOTRIN) 800 MG tablet, Take 1 tablet (800 mg total) by mouth 3 (three) times daily., Disp: 21 tablet, Rfl: 0   metroNIDAZOLE (FLAGYL) 500 MG  tablet, Take 1 tablet (500 mg total) by mouth 2 (two) times daily., Disp: 14 tablet, Rfl: 0   naproxen (NAPROSYN) 500 MG tablet, Take 1 tablet (500 mg total) by mouth 2 (two) times daily., Disp: 60 tablet, Rfl: 0  Observations/Objective: Patient is well-developed, well-nourished in no acute distress.  Resting comfortably  at home.  Head is normocephalic, atraumatic.  No labored breathing.  Speech is clear and coherent with logical content.  Patient is alert and oriented at baseline.    Assessment and Plan: 1. Situational depression Based on high PHQ9 score without pre existing depression advised patient is seen tonight at [facility] in [place]  She is agreeable to that plan and has transportation   Follow up with [facility] with any new concerns  Continue as well with scheduled PCP appointment later this month      Follow Up Instructions: I discussed the assessment and treatment plan with the patient. The patient was provided an opportunity to ask questions and all were answered. The patient agreed with the plan and demonstrated an understanding of the instructions.  A copy of instructions were sent to the patient via MyChart unless otherwise noted below.    The patient was advised to call back or seek an in-person evaluation if the symptoms worsen or if the condition fails to improve as anticipated.  Time:  I spent 10 minutes with the patient via telehealth technology discussing the above problems/concerns.    Viviano Simas, FNP

## 2022-12-03 NOTE — Patient Instructions (Addendum)
[email or web address]  Phone:  [phone]  Address:  [address]  [place], [place] [address]  Hours:  Open 24/7, No appointment required.

## 2022-12-21 ENCOUNTER — Ambulatory Visit (HOSPITAL_COMMUNITY)
Admission: EM | Admit: 2022-12-21 | Discharge: 2022-12-21 | Disposition: A | Discharge status: Home / Self Care | Payer: Medicaid Other | Attending: Registered Nurse | Admitting: Registered Nurse

## 2022-12-21 DIAGNOSIS — H9313 Tinnitus, bilateral: Secondary | ICD-10-CM | POA: Diagnosis present

## 2022-12-21 DIAGNOSIS — R45851 Suicidal ideations: Secondary | ICD-10-CM

## 2022-12-21 DIAGNOSIS — F331 Major depressive disorder, recurrent, moderate: Secondary | ICD-10-CM | POA: Diagnosis present

## 2022-12-21 NOTE — ED Provider Notes (Signed)
Behavioral Health Urgent Care Medical Screening Exam  Patient Name: Morgan Morales MRN: [id number] Date of Evaluation: 12/21/22 Chief Complaint:   Diagnosis:  Final diagnoses:  MDD (major depressive disorder), recurrent episode, moderate (HCC)  Passive suicidal ideations    History of Present illness: Morgan Morales is a 32 y.o. female patient presented to [facility] as a walk in with complaints of depression and requesting outpatient psychiatric services  Morgan Morales, 32 y.o., female patient seen face to face by this provider, chart reviewed, and consulted with Dr. Nelly Rout on 12/21/22.  On evaluation Morgan Morales reports she has had depression since the death of her mother 3 yrs ago.  States she feels it more during times when she is overwhelmed  and states "Right now I've got a lot on me and I've needed help since my mother died.  I just feel overwhelmed and fel like a failure.  I just need help.  Patient states that she doesn't want to kill herself but sometimes have thoughts that no one would miss her if she was gone or not caring if she doesn't wake.  She states she would never kill herself and denies prior history of suicide attempt and self-harming behavior.  Patient also denies prior in/outpatient services and psychotropic medications.  Patient denies suicidal/self-harm/homicidal ideation, psychosis, and paranoia.  She states that she does sometimes see ghost and described as shadows.  She states that she has a constant ringing in her ears which she was describing as auditory hallucinations.  The ring in both ears is constant and louder at night when there are no other noises.   During evaluation Morgan Morales is seated in exam room dressed appropriated for weather.  There is no noted distress but patient does become tearful when talking about her mothers death.  Patient is alert/oriented x 4, calm, cooperative, attentive, and responses were relevant and  appropriate to assessment questions.  She spoke in a clear tone at moderate volume, and normal pace, with good eye contact.   She denies suicidal/self-harm/homicidal ideation, psychosis, and paranoia.  Objectively there is no evidence of psychosis/mania or delusional thinking.  She conversed coherently, with goal directed thoughts, no distractibility, or pre-occupation.  Referred to [facility] outpatient PHP program.  Other resources also given  At this time Morgan Morales is educated and verbalizes understanding of mental health resources and other crisis services in the community. She is instructed to call 911 and present to the nearest emergency room should she experience any suicidal/homicidal ideation, auditory/visual/hallucinations, or detrimental worsening of her mental health condition.  She was a also advised by Clinical research associate that she could call the toll-free phone on back of  insurance card to assist with identifying counselors and agencies in network or number on back of Medicaid card to speak with care coordinator.    Flowsheet Row ED from 12/21/2022 in [facility] ED from [facility]28/2022 in [facility] at [facility]  C-SSRS RISK CATEGORY No Risk No Risk       Psychiatric Specialty Exam  Presentation  General Appearance:Appropriate for Environment; Casual  Eye Contact:Good  Speech:Clear and Coherent; Normal Rate  Speech Volume:Normal  Handedness:Right   Mood and Affect  Mood: Depressed  Affect: Congruent; Depressed; Tearful   Thought Process  Thought Processes: Coherent; Goal Directed  Descriptions of Associations:Intact  Orientation:Full (Time, Place and Person)  Thought Content:Logical; WDL    Hallucinations:None  Ideas of Reference:None  Suicidal  Thoughts:Yes, Passive Without Intent; Without Plan  Homicidal Thoughts:No   Sensorium  Memory: Immediate Good; Recent Good; Remote  Good  Judgment: Intact  Insight: Present   Executive Functions  Concentration: Good  Attention Span: Good  Recall: Good  Fund of Knowledge: Good  Language: Good   Psychomotor Activity  Psychomotor Activity: Normal   Assets  Assets: Communication Skills; Desire for Improvement; Housing; Physical Health; Social Support; Transportation   Sleep  Sleep: Fair  Number of hours: No data recorded  Physical Exam: Physical Exam Vitals and nursing note reviewed.  Constitutional:      General: She is not in acute distress.    Appearance: Normal appearance. She is not ill-appearing.  HENT:     Head: Normocephalic.  Cardiovascular:     Rate and Rhythm: Normal rate.  Pulmonary:     Effort: Pulmonary effort is normal. No respiratory distress.  Musculoskeletal:        General: Normal range of motion.     Cervical back: Normal range of motion.  Skin:    General: Skin is warm and dry.  Neurological:     Mental Status: She is alert and oriented to person, place, and time.  Psychiatric:        Attention and Perception: Attention and perception normal. She does not perceive auditory or visual hallucinations.        Mood and Affect: Mood is anxious and depressed. Affect is tearful.        Speech: Speech normal.        Behavior: Behavior normal. Behavior is cooperative.        Thought Content: Thought content normal. Suicidal: Reported passive thoughts sometime but not currently.        Cognition and Memory: Cognition and memory normal.        Judgment: Judgment normal.    Review of Systems  Constitutional:        No other complaints voiced  Psychiatric/Behavioral:  Positive for depression. Hallucinations: States sees shadows on/off but not currently.  States she has ringing in her ears that is louder when she tries to go to sleep.. Suicidal ideas: Reports passive thoughts on/off but not currently.  Contracts for safety.  No prior history of suicide attempt or self  harming behavior.The patient is nervous/anxious.   All other systems reviewed and are negative.  Blood pressure 135/89, pulse 85, temperature 98.8 F (37.1 C), temperature source Oral, resp. rate (!) 79, SpO2 100%. There is no height or weight on file to calculate BMI.  Musculoskeletal: Strength & Muscle Tone: within normal limits Gait & Station: normal Patient leans: N/A   [facility] MSE Discharge Disposition for Follow up and Recommendations: Based on my evaluation the patient does not appear to have an emergency medical condition and can be discharged with resources and follow up care in outpatient services for Medication Management and Partial Hospitalization Program  Scheduled appointment for PHP intake on 10/23 at 1:00 PM at [facility] Carlise Stofer, NP 12/21/2022, 1:59 PM

## 2022-12-21 NOTE — ED Notes (Signed)
Patient dc by provider

## 2022-12-21 NOTE — Progress Notes (Signed)
   12/21/22 1054  [facility] Triage Screening (Walk-ins at [facility] only)  How Did You Hear About [place]? Self  What Is the Reason for Your Visit/Call Today? Morgan Morales is a 32 year old female presenting to [facility] voluntarily unaccompanied. Pt reports she is overwhelmed and depressed. Pt mentions this depression has been ongoing for 3 years since she lost her mother. Pt has no known mental health disorders at this time. Pt denies to be on medication for depression at this time. Pt does report to have a therapist, but mentions it has been a long time since she has seen her therapist. Pt also reports she has passive thoughts of wanting to end her life, but states, "I would never really do it". Pt mentions she also has passive hallucinations almost daily. Pt mentions she is here looking for medication and outpatient therapy services to help with her depression. Pt denies substance use, SI, HI and AVH currently.  How Long Has This Been Causing You Problems? > than 6 months  Have You Recently Had Any Thoughts About Hurting Yourself? No  Are You Planning to Commit Suicide/Harm Yourself At This time? No  Have you Recently Had Thoughts About Hurting Someone Morgan Morales? No  Are You Planning To Harm Someone At This Time? No  Are you currently experiencing any auditory, visual or other hallucinations? No  Have You Used Any Alcohol or Drugs in the Past 24 Hours? No  Do you have any current medical co-morbidities that require immediate attention? No  Clinician description of patient physical appearance/behavior: tearful, nervous  What Do You Feel Would Help You the Most Today? Medication(s);Stress Management;Treatment for Depression or other mood problem  If access to [facility] Urgent Care was not available, would you have sought care in the Emergency Department? No  Determination of Need Routine (7 days)  Options For Referral Medication Management;Intensive Outpatient Therapy

## 2022-12-21 NOTE — Discharge Instructions (Addendum)
"You are scheduled for an assessment for the PHP on Wednesday, 12/23/22 at 1:00 PM. This appointment will last approximately one hour and will be virtual via [employer] Please download the Teams app prior to the appointment. If you need to cancel or reschedule, please call [phone] and leave a voicemail with your name, date of birth, and phone number."  Patients' email address:  [email or web address] Microsoft Teams.  Down load app for PHP services    [facility]: Outpatient psychiatric Services:   Please see the walk in hours listed below.  Medication Management New Patient needing Medication Management Walk-in, and Existing Patients needing to see a provider for management coming as a walk in   Monday thru Friday 8:00 AM first come first serve until slots are full.  Recommend being there by 7:15 AM to ensure a slot is open.  Therapy New Patient Therapy Intake and Existing Patients needing to see therapist coming in as a walk in.   Monday, Wednesday, and Thursday morning at 8:00 am first come first serve.  Recommend being there by 7:15 AM to ensure a slot is open.    Every 1st, 2nd, and 3rd Friday at 1:00 PM first come first serve until slots are full.  Will still need to come in that morning at 7:15 AM to get registered for an afternoon slot.  For all walk-ins we ask that you arrive by 7:15 am because patients will be seen in there order of arrival (FIRST COME FIRST SERVE) Availability is limited, therefore you may not be seen on the same day that you walk in if all slots are full.    Our goal is to serve and meet the needs of our community to the best of our ability.      [facility] Phone: [phone] Physical Address:  [address] [place], [place]  [address]  Outpatient Services Life can be a challenge for [place] all. [facility]'s outpatient services offer a caring and experienced  team of professionals who help people take the first step, which is often the most difficult. Together, we develop a well-defined and customized plan for each person that meets the individual's needs and goals. Each plan includes evidence-based practices as proven strategies that work. From board-certified psychiatrists, registered nurses, therapists, and outpatient office administrative professionals--all care and want to help you and your loved ones in every way possible to ensure you succeed.  Open Access:   One way we ensure we get people the help they need when they request is is through Open Access. This service encourages individuals who are in dire need of our services and are new to [facility] to simply walk in or call us for virtual options, Monday through Friday between 8 a.m. and 3 p.m. On the same day of contact, if the individual has time to do so, he/she/they will complete patient registration and a comprehensive clinical assessment with a therapist. The assessment will provide treatment recommendations and the individual will leave with an appointment for the next service or a referral to the proper level of care.  While this process takes a few hours and is longer than a traditional appointment, it reduces what could otherwise be months of waiting for help or an appointment.   Telehealth Services:  [facility]'s telehealth services provide a safe, secure, and easy way to connect with a therapist or mental health provider for an individual or group therapy appointment. Click here to learn  more about how [facility]'s telehealth services provide an important treatment option. These services may be accessed from the comfort of an individual's home, or at one of [facility]'s behavioral health offices such as this one where an individual may use on-site equipment for the visit.   Telehealth Services   A SAFE, SECURE, CONVENIENT TREATMENT OPTION:  [facility] telehealth services provide you with a safe, secure,  and easy way to connect with your therapist or mental health provider for an individual or group therapy appointment.  Using Psychologist, prison and probation services, telehealth appointments allow you to meet with [employer], therapists, nurse practitioners, and psychiatrists from your desktop or laptop computer, cell phone, or tablet device. Telehealth visits are compliant with all Health Insurance Portability and Accountability Act (HIPAA) requirements and you can complete a telehealth visit from just about anywhere using internet or wi-fi access.  HOW DOES IT WORK?  [facility] uses the [employer] platform to host telehealth appointments. Prior to your scheduled visit, you will receive a direct link via text or email which will take you to your provider's online waiting room. Simply click that link at your appointment time and your provider will be notified that you've arrived. He or she will meet you online and you will complete your visit. Your provider may also have resources and information posted in his or her virtual waiting room which you may find helpful throughout your treatment.  In addition, you may receive a reminder telephone call from a [place] team member in the days leading up to your appointment. During that call, you will have an opportunity to provide important health information and medication updates which may save time during your scheduled appointment.     WHO USES TELEHEALTH SERVICES?  Telehealth services provide an alternative to in-person, face-to-face treatment for individuals receiving outpatient behavioral health services. At [facility], telehealth visits may also be used by individuals receiving Assertive Community Treatment (ACT) Team and Individual Placement and Support (IPS) services and other community-based, specialized services as needed. Telehealth services are also used for group therapy sessions, allowing people we support to connect during treatment with others who have similar  experiences.

## 2022-12-22 ENCOUNTER — Telehealth: Payer: Self-pay

## 2022-12-22 ENCOUNTER — Telehealth: Payer: Medicaid Other | Admitting: Physician Assistant

## 2022-12-22 ENCOUNTER — Telehealth (HOSPITAL_COMMUNITY): Payer: Self-pay | Admitting: Licensed Clinical Social Worker

## 2022-12-22 NOTE — Telephone Encounter (Signed)
Cln called to orient pt to [facility] and obtain email address for [facility]. Pt provided email address and requested med man appointment prior to [facility], stating "I think the medication would help me want to do group because right now I be tired and don't feel like it." Cln explained that for PHP pt would complete assessment, start group and then see a provider while in group to discuss medication. Pt requested an appointment for medication first. Cln confirmed pt's request, asking if she is requesting to be scheduled with a psychiatric provider prior to being scheduled for an assessment for group therapy. Pt responded, "Yes ma'am." Cln informed pt that [facility] outpatient schedulers will be informed of pt's request and will be asked to call pt to schedule, but that she may need to utilize walk-in hours. Pt verbalized understanding. Cln provided PHP phone number for pt to call when she wants to be assessed for PHP and asked pt again if she is interested in being assessed for a group therapy program that is four hours per day and five days per week. Pt said, "Yes ma'am." Per Jacky Kindle at [facility] outpatient, pt reports she only wants medication management.

## 2022-12-22 NOTE — Progress Notes (Signed)
The patient no-showed for appointment despite this provider sending direct link, reaching out via phone with no response and waiting for at least 10 minutes from appointment time for patient to join. They will be marked as a NS for this appointment/time.  ? ?Ryllie Nieland Cody Cliford Sequeira, PA-C ? ? ? ?

## 2022-12-22 NOTE — Telephone Encounter (Signed)
Copied from CRM [id number]. Topic: General - Other >> Dec 22, 2022  9:42 AM Franchot Heidelberg wrote: Reason for CRM: Pt called requesting to speak with the clinic about medication she is supposed to take but has questions.   Best contact:  [phone]

## 2022-12-23 ENCOUNTER — Ambulatory Visit (INDEPENDENT_AMBULATORY_CARE_PROVIDER_SITE_OTHER): Payer: Medicaid Other | Admitting: Primary Care

## 2022-12-23 ENCOUNTER — Encounter (INDEPENDENT_AMBULATORY_CARE_PROVIDER_SITE_OTHER): Payer: Self-pay

## 2022-12-23 ENCOUNTER — Ambulatory Visit (HOSPITAL_COMMUNITY): Payer: Medicaid Other

## 2022-12-23 NOTE — Telephone Encounter (Signed)
Called but no answer. Unable to LVM due to VM being full.

## 2022-12-25 NOTE — Telephone Encounter (Signed)
Called but no answer. Unable to LVM due to VM being full.

## 2022-12-28 NOTE — Telephone Encounter (Signed)
Called but no answer. LVM to call back. Third and final attempt.

## 2023-01-01 ENCOUNTER — Ambulatory Visit (HOSPITAL_COMMUNITY): Payer: Medicaid Other | Admitting: Student

## 2023-01-21 ENCOUNTER — Telehealth (INDEPENDENT_AMBULATORY_CARE_PROVIDER_SITE_OTHER): Payer: Self-pay | Admitting: Primary Care

## 2023-01-21 NOTE — Telephone Encounter (Signed)
Called to remind pt about apt. Could not leave VM because mailbox was full

## 2023-01-22 ENCOUNTER — Telehealth (INDEPENDENT_AMBULATORY_CARE_PROVIDER_SITE_OTHER): Payer: Self-pay | Admitting: Family Medicine

## 2023-01-22 ENCOUNTER — Ambulatory Visit (INDEPENDENT_AMBULATORY_CARE_PROVIDER_SITE_OTHER): Payer: Medicaid Other | Admitting: Primary Care

## 2023-01-22 NOTE — Telephone Encounter (Signed)
Called pt to inform them that we are having power issues at [facility] today and we are seeing pts at [facility]. Her appointment is at 8:50. Pt did not answer and VM was left.

## 2023-01-26 ENCOUNTER — Encounter (HOSPITAL_COMMUNITY): Payer: Self-pay

## 2023-02-02 ENCOUNTER — Ambulatory Visit (HOSPITAL_COMMUNITY): Payer: Medicaid Other | Admitting: Student

## 2023-03-04 ENCOUNTER — Encounter (HOSPITAL_COMMUNITY): Payer: Self-pay

## 2023-03-04 ENCOUNTER — Other Ambulatory Visit: Payer: Self-pay

## 2023-03-04 ENCOUNTER — Emergency Department (HOSPITAL_COMMUNITY)
Admission: EM | Admit: 2023-03-04 | Discharge: 2023-03-04 | Disposition: A | Discharge status: Home / Self Care | Payer: Medicaid Other | Attending: Emergency Medicine | Admitting: Emergency Medicine

## 2023-03-04 DIAGNOSIS — M79604 Pain in right leg: Secondary | ICD-10-CM | POA: Insufficient documentation

## 2023-03-04 DIAGNOSIS — R21 Rash and other nonspecific skin eruption: Secondary | ICD-10-CM | POA: Diagnosis not present

## 2023-03-04 DIAGNOSIS — M79605 Pain in left leg: Secondary | ICD-10-CM | POA: Diagnosis not present

## 2023-03-04 DIAGNOSIS — R600 Localized edema: Secondary | ICD-10-CM | POA: Insufficient documentation

## 2023-03-04 LAB — CBC WITH DIFFERENTIAL/PLATELET
Abs Immature Granulocytes: 0.01 10*3/uL (ref 0.00–0.07)
Basophils Absolute: 0 10*3/uL (ref 0.0–0.1)
Basophils Relative: 0 %
Eosinophils Absolute: 0.4 10*3/uL (ref 0.0–0.5)
Eosinophils Relative: 5 %
HCT: 36.6 % (ref 36.0–46.0)
Hemoglobin: 12 g/dL (ref 12.0–15.0)
Immature Granulocytes: 0 %
Lymphocytes Relative: 47 %
Lymphs Abs: 3.9 10*3/uL (ref 0.7–4.0)
MCH: 30.8 pg (ref 26.0–34.0)
MCHC: 32.8 g/dL (ref 30.0–36.0)
MCV: 94.1 fL (ref 80.0–100.0)
Monocytes Absolute: 0.6 10*3/uL (ref 0.1–1.0)
Monocytes Relative: 7 %
Neutro Abs: 3.4 10*3/uL (ref 1.7–7.7)
Neutrophils Relative %: 41 %
Platelets: 371 10*3/uL (ref 150–400)
RBC: 3.89 MIL/uL (ref 3.87–5.11)
RDW: 12.8 % (ref 11.5–15.5)
WBC: 8.4 10*3/uL (ref 4.0–10.5)
nRBC: 0 % (ref 0.0–0.2)

## 2023-03-04 LAB — URINALYSIS, ROUTINE W REFLEX MICROSCOPIC
Bilirubin Urine: NEGATIVE
Glucose, UA: NEGATIVE mg/dL
Hgb urine dipstick: NEGATIVE
Ketones, ur: NEGATIVE mg/dL
Nitrite: NEGATIVE
Protein, ur: 30 mg/dL — AB
Specific Gravity, Urine: 1.024 (ref 1.005–1.030)
pH: 6 (ref 5.0–8.0)

## 2023-03-04 LAB — COMPREHENSIVE METABOLIC PANEL
ALT: 26 U/L (ref 0–44)
AST: 49 U/L — ABNORMAL HIGH (ref 15–41)
Albumin: 3.2 g/dL — ABNORMAL LOW (ref 3.5–5.0)
Alkaline Phosphatase: 55 U/L (ref 38–126)
Anion gap: 5 (ref 5–15)
BUN: 6 mg/dL (ref 6–20)
CO2: 29 mmol/L (ref 22–32)
Calcium: 8.6 mg/dL — ABNORMAL LOW (ref 8.9–10.3)
Chloride: 100 mmol/L (ref 98–111)
Creatinine, Ser: 0.7 mg/dL (ref 0.44–1.00)
GFR, Estimated: >60 mL/min (ref >60)
Glucose, Bld: 102 mg/dL — ABNORMAL HIGH (ref 70–99)
Potassium: 3.7 mmol/L (ref 3.5–5.1)
Sodium: 134 mmol/L — ABNORMAL LOW (ref 135–145)
Total Bilirubin: 0.4 mg/dL (ref 0.0–1.2)
Total Protein: 7.6 g/dL (ref 6.5–8.1)

## 2023-03-04 LAB — PREGNANCY, URINE: Preg Test, Ur: NEGATIVE

## 2023-03-04 LAB — BRAIN NATRIURETIC PEPTIDE: B Natriuretic Peptide: 109.3 pg/mL — ABNORMAL HIGH (ref 0.0–100.0)

## 2023-03-04 MED ORDER — TRIAMCINOLONE ACETONIDE 0.1 % EX CREA: 1.0000 | TOPICAL_CREAM | Freq: Two times a day (BID) | CUTANEOUS | 0 refills | Status: DC

## 2023-03-04 MED ORDER — KETOROLAC TROMETHAMINE 15 MG/ML IJ SOLN
15.0000 mg | Freq: Once | INTRAMUSCULAR | Status: AC
  Administered 2023-03-04: 15 mg via INTRAMUSCULAR

## 2023-03-04 NOTE — Discharge Instructions (Addendum)
 You were seen for the rash on your legs and there is swelling in the emergency department.   At home, please wear compression stockings.  Take Tylenol  and ibuprofen  for your pain.  Take Benadryl for the itching. You may try to topical steroid we have prescribed you for itching as well.  Check your MyChart online for the results of any tests that had not resulted by the time you left the emergency department.   Follow-up with your primary doctor in 2-3 days regarding your visit.  Follow-up with dermatology as soon as possible for a biopsy.  Return immediately to the emergency department if you experience any of the following: Fevers, worsening pain, chest pain, shortness of breath, or any other concerning symptoms.    Thank you for visiting our Emergency Department. It was a pleasure taking care of you today.

## 2023-03-04 NOTE — ED Provider Notes (Signed)
  EMERGENCY DEPARTMENT AT [facility] Provider Note   CSN: [id number] Arrival date & time: 03/04/23  9089     History  Chief Complaint  Patient presents with   Leg Pain   Leg Swelling    Morgan Morales is a 33 y.o. female.  33 year old female who presents emergency department with rash and leg pain.  For the past 2 days has had a pruritic and painful rash on both of her legs that is started spreading up towards her thighs.  Says that her legs have also been swollen in that time as well.  Denies mucosal involvement.  No palm or sole involvement.  Has tried Tylenol , ibuprofen , and Benadryl without relief of her symptoms.  No new detergents or medications.  Has not been outside with any contact to insects or poison ivy.  Denies any fever, urinary changes, vaginal discharge, chest pain, shortness of breath.  No concerns for STIs at this time.  No new drugs or medications.       Home Medications Prior to Admission medications   Medication Sig Start Date End Date Taking? Authorizing Provider  triamcinolone  cream (KENALOG ) 0.1 % Apply 1 Application topically 2 (two) times daily. 03/04/23  Yes Yolande Lamar BROCKS, MD  acetaminophen  (TYLENOL ) 325 MG tablet Take 2 tablets (650 mg total) by mouth every 6 (six) hours as needed. 03/22/16   Law, Alexandra M, PA-C  doxycycline  (VIBRAMYCIN ) 100 MG capsule Take 1 capsule (100 mg total) by mouth 2 (two) times daily. 06/09/19   Adah Corning A, NP  gabapentin  (NEURONTIN ) 100 MG capsule Take 1 capsule (100 mg total) by mouth 3 (three) times daily as needed. 04/28/22   Ethyl Richerd BROCKS, MD  ibuprofen  (ADVIL ,MOTRIN ) 800 MG tablet Take 1 tablet (800 mg total) by mouth 3 (three) times daily. 04/22/17   McDonald, Mia A, PA-C  metroNIDAZOLE  (FLAGYL ) 500 MG tablet Take 1 tablet (500 mg total) by mouth 2 (two) times daily. 06/09/19   Adah Corning A, NP  naproxen  (NAPROSYN ) 500 MG tablet Take 1 tablet (500 mg total) by mouth 2 (two) times daily.  04/28/22   Ethyl Richerd BROCKS, MD      Allergies    Patient has no known allergies.    Review of Systems   Review of Systems  Physical Exam Updated Vital Signs BP 132/78 (BP Location: Left Arm)   Pulse 74   Temp 98.2 F (36.8 C) (Oral)   Resp 16   Ht 5' 4 (1.626 m)   Wt 58.1 kg   SpO2 99%   BMI 21.97 kg/m  Physical Exam Vitals and nursing note reviewed.  Constitutional:      General: She is not in acute distress.    Appearance: She is well-developed.  HENT:     Head: Normocephalic and atraumatic.     Right Ear: External ear normal.     Left Ear: External ear normal.     Nose: Nose normal.     Mouth/Throat:     Mouth: Mucous membranes are moist.     Pharynx: Oropharynx is clear. No oropharyngeal exudate or posterior oropharyngeal erythema.  Eyes:     Extraocular Movements: Extraocular movements intact.     Conjunctiva/sclera: Conjunctivae normal.     Pupils: Pupils are equal, round, and reactive to light.  Cardiovascular:     Rate and Rhythm: Normal rate and regular rhythm.     Heart sounds: No murmur heard. Pulmonary:     Effort: Pulmonary effort  is normal. No respiratory distress.     Breath sounds: Normal breath sounds.  Abdominal:     General: Abdomen is flat. There is no distension.     Palpations: Abdomen is soft. There is no mass.     Tenderness: There is no abdominal tenderness. There is no guarding.  Musculoskeletal:     Cervical back: Normal range of motion and neck supple.     Right lower leg: Edema present.     Left lower leg: Edema present.  Skin:    General: Skin is warm and dry.     Findings: Rash (Blanching.  Not on soles of feet.  Also present on medial aspect of thighs.) present.  Neurological:     Mental Status: She is alert and oriented to person, place, and time. Mental status is at baseline.  Psychiatric:        Mood and Affect: Mood normal.   Legs:   ED Results / Procedures / Treatments   Labs (all labs ordered are listed, but  only abnormal results are displayed) Labs Reviewed  COMPREHENSIVE METABOLIC PANEL - Abnormal; Notable for the following components:      Result Value   Sodium 134 (*)    Glucose, Bld 102 (*)    Calcium 8.6 (*)    Albumin 3.2 (*)    AST 49 (*)    All other components within normal limits  BRAIN NATRIURETIC PEPTIDE - Abnormal; Notable for the following components:   B Natriuretic Peptide 109.3 (*)    All other components within normal limits  URINALYSIS, ROUTINE W REFLEX MICROSCOPIC - Abnormal; Notable for the following components:   APPearance HAZY (*)    Protein, ur 30 (*)    Leukocytes,Ua TRACE (*)    Bacteria, UA RARE (*)    All other components within normal limits  CBC WITH DIFFERENTIAL/PLATELET  PREGNANCY, URINE    EKG None  Radiology No results found.  Procedures Procedures    Medications Ordered in ED Medications  ketorolac  (TORADOL ) 15 MG/ML injection 15 mg (15 mg Intramuscular Given 03/04/23 2235)    ED Course/ Medical Decision Making/ A&P                                 Medical Decision Making Amount and/or Complexity of Data Reviewed Labs: ordered.  Risk Prescription drug management.   Morgan Morales is a 33 y.o. female who presents to the emergency department with rash and leg swelling  Initial Ddx:  IgA vasculitis, contact dermatitis, CHF, venous stasis, idiopathic urticaria  MDM/Course:  Patient presents to the emergency department with a rash on her legs.  Does not involve the palms or soles or mucosal.  No contact with skin irritants that is new.  No new drugs or medication that she is taking that would suggest a drug eruption.  No other infectious symptoms.  Not having any abdominal pain.  She did have lab work that was unremarkable today without signs of AKI or significant eosinophilia.  Given the appearance of the swelling and somewhat concerned about possible IgA vasculitis.  Is instructed to take Tylenol  and ibuprofen  for the pain and  continue oral antihistamines.  Was also given a prescription for triamcinolone  cream and instructed to follow-up with dermatology for a biopsy.  Considered CHF but with normal BNP without any risk factors show this is highly unlikely especially in the setting of this rash which I  suspect is causing it.  Also unlikely to have a DVT given her symptoms since she is having bilateral pain and does have the rash.    This patient presents to the ED for concern of complaints listed in HPI, this involves an extensive number of treatment options, and is a complaint that carries with it a high risk of complications and morbidity. Disposition including potential need for admission considered.   Dispo: DC Home. Return precautions discussed including, but not limited to, those listed in the AVS. Allowed pt time to ask questions which were answered fully prior to dc.  Records reviewed Outpatient Clinic Notes The following labs were independently interpreted: Chemistry and show no acute abnormality I personally reviewed and interpreted cardiac monitoring: normal sinus rhythm  I personally reviewed and interpreted the pt's EKG: see above for interpretation  I have reviewed the patients home medications and made adjustments as needed  Portions of this note were generated with Dragon dictation software. Dictation errors may occur despite best attempts at proofreading.     Final Clinical Impression(s) / ED Diagnoses Final diagnoses:  Rash  Peripheral edema    Rx / DC Orders ED Discharge Orders          Ordered    triamcinolone  cream (KENALOG ) 0.1 %  2 times daily        03/04/23 2326              Yolande Lamar BROCKS, MD 03/05/23 1348

## 2023-03-04 NOTE — ED Triage Notes (Signed)
 PT arrives via POV. PT reports pain and swelling to bilateral lower extremities for the past 2 weeks. She states she developed a rash on her legs 2 days ago. Pt AxOx4. Denies cp or sob.

## 2023-05-14 ENCOUNTER — Ambulatory Visit: Payer: Self-pay | Admitting: *Deleted

## 2023-05-14 NOTE — Telephone Encounter (Signed)
  Chief Complaint: Nexplanon(placed by Health dept)- expired- abnormal bleeding Symptoms: patient reports she has Nexplanon that more than likely has expired and she reports for over 1 year she has ben having regular cycle. Patient reports she has been having dark brown discharge with odor for 2 weeks. Patient is concerned- she has upcoming NP appointment - but not until April.  Frequency: 2 weeks Pertinent Negatives: Patient denies pain/cramping Disposition: [] ED /[x] Urgent Care (no appt availability in office) / [] Appointment(In office/virtual)/ []  [facility]/ [] Home Care/ [] Refused Recommended Disposition /[]  Mobile Bus/ []  Follow-up with PCP Additional Notes: Patient advised of the many reasons she could have the abnormal bleeding- care options given - she is going to go to [facility] for evaluation.   Copied from CRM [id number]. Topic: Clinical - Red Word Triage >> May 14, 2023  3:18 PM Elle L wrote: Red Word that prompted transfer to Nurse Triage: The patient has Nexplanon that she believes is expired because she has been having her normal cycle. However, she started spotting again and it is brown and has a smell. She is unsure if this is due to it being expired or a symptom of something else. Reason for Disposition  [1] Caller has URGENT question AND [2] triager unable to answer question  Answer Assessment - Initial Assessment Questions 1. IMPLANT TYPE: "What type of implant are you using?"  (e.g., Jadelle, Nexplanon, Norplant)      Nexplanon 2. IMPLANT START DATE: "When did you first start using the implant?" (e.g., date; weeks, months, years ago)      Over 5-7 years 3. IMPLANT LOCATION: "Where is implant located?" (e.g., abdomen, arm; left or right)     Left arm 4. SYMPTOM: "What is the main symptom (or question) you're concerned about?"     Patient is having menstrual cycle - at least 1 year normal cycle 5. ONSET: "When did the bleeding start?"     2 weeks - brownish  type spotting 6. VAGINAL BLEEDING: "Are you having any unusual vaginal bleeding?"   - NONE: no bleeding   - SPOTTING: spotting, or pinkish / brownish mucous discharge; does not fill panty liner or pad    - MILD:  less than 1 pad / hour; less than patient's usual menstrual bleeding   - MODERATE: 1-2 pads / hour; 1 menstrual cup every 6 hours; small-medium blood clots (e.g., pea, grape, small coin)   - SEVERE: soaking 2 or more pads/hour for 2 or more hours; 1 menstrual cup every 2 hours; bleeding not contained by pads or continuous red blood from vagina; large blood clots (e.g., golf ball, large coin)      Brownish discharge 7. ABDOMEN OR PELVIC PAIN: "Are you having any pain in your abdomen or pelvic area?" (Scale: 0, 1-10; none, mild, moderate, severe)   - NONE (0): no pain   - MILD (1-3): doesn't interfere with normal activities, abdomen soft and not tender to touch    - MODERATE (4-7): interferes with normal activities or awakens from sleep, abdomen tender to touch    - SEVERE (8-10): excruciating pain, doubled over, unable to do any normal activities      no 8. PREGNANCY: "Are you concerned you might be pregnant?" "When was your last menstrual period?"     No birth control  Protocols used: Contraception - Implant Symptoms and Questions-A-AH

## 2023-05-20 ENCOUNTER — Ambulatory Visit (INDEPENDENT_AMBULATORY_CARE_PROVIDER_SITE_OTHER): Payer: Medicaid Other | Admitting: Primary Care

## 2023-05-31 ENCOUNTER — Telehealth (INDEPENDENT_AMBULATORY_CARE_PROVIDER_SITE_OTHER): Payer: Self-pay | Admitting: Primary Care

## 2023-05-31 NOTE — Telephone Encounter (Signed)
 Called pt to confirm appt. Pt did not answer and pt could not leave VM.

## 2023-06-07 ENCOUNTER — Ambulatory Visit (INDEPENDENT_AMBULATORY_CARE_PROVIDER_SITE_OTHER): Payer: Medicaid Other | Admitting: Primary Care

## 2023-06-07 ENCOUNTER — Encounter (INDEPENDENT_AMBULATORY_CARE_PROVIDER_SITE_OTHER): Payer: Self-pay | Admitting: Primary Care

## 2023-06-07 VITALS — BP 130/88 | Resp 16 | Ht 63.0 in | Wt 134.4 lb

## 2023-06-07 DIAGNOSIS — M25531 Pain in right wrist: Secondary | ICD-10-CM

## 2023-06-07 DIAGNOSIS — Z7689 Persons encountering health services in other specified circumstances: Secondary | ICD-10-CM | POA: Diagnosis not present

## 2023-06-07 DIAGNOSIS — Z3046 Encounter for surveillance of implantable subdermal contraceptive: Secondary | ICD-10-CM | POA: Diagnosis not present

## 2023-06-07 DIAGNOSIS — G4709 Other insomnia: Secondary | ICD-10-CM | POA: Diagnosis not present

## 2023-06-07 DIAGNOSIS — F4323 Adjustment disorder with mixed anxiety and depressed mood: Secondary | ICD-10-CM | POA: Diagnosis not present

## 2023-06-07 DIAGNOSIS — M25532 Pain in left wrist: Secondary | ICD-10-CM | POA: Diagnosis not present

## 2023-06-07 MED ORDER — QUETIAPINE FUMARATE 25 MG PO TABS: 25.0000 mg | ORAL_TABLET | Freq: Every day | ORAL | 0 refills | Status: DC

## 2023-06-07 NOTE — Patient Instructions (Signed)
 Quetiapine Tablets What is this medication? QUETIAPINE (kwe TYE a peen) treats schizophrenia and bipolar disorder. It works by balancing the levels of dopamine and serotonin in your brain, hormones that help regulate mood, behaviors, and thoughts. It belongs to a group of medications called antipsychotics. Antipsychotic medications can be used to treat several kinds of mental health conditions. This medicine may be used for other purposes; ask your health care provider or pharmacist if you have questions. COMMON BRAND NAME(S): Seroquel What should I tell my care team before I take this medication? They need to know if you have any of these conditions: Blockage in your bowels Cataracts Constipation Dementia Diabetes Difficulty swallowing Glaucoma Heart disease High levels of prolactin History of breast cancer History of irregular heartbeat Liver disease Low blood cell levels (white cells, red cells, and platelets) Low blood pressure Parkinson disease Prostate disease Seizures Suicidal thoughts, plans, or attempt by you or a family member Thyroid disease Trouble passing urine An unusual or allergic reaction to quetiapine, other medications, foods, dyes, or preservatives Pregnant or trying to get pregnant Breastfeeding How should I use this medication? Take this medication by mouth with water. Take it as directed on the prescription label at the same time every day. You can take it with or without food. If it upsets your stomach, take it with food. Keep taking it unless your care team tells you to stop. A special MedGuide will be given to you by the pharmacist with each prescription and refill. Be sure to read this information carefully each time. Talk to your care team about the use of this medication in children. While this medication may be prescribed for children as young as 10 years for selected conditions, precautions do apply. People over 74 years of age may have a stronger  reaction to this medication and need smaller doses. Overdosage: If you think you have taken too much of this medicine contact a poison control center or emergency room at once. NOTE: This medicine is only for you. Do not share this medicine with others. What if I miss a dose? If you miss a dose, take it as soon as you can. If it is almost time for your next dose, take only that dose. Do not take double or extra doses. What may interact with this medication? Do not take this medication with any of the following: Cisapride Dronedarone Metoclopramide Pimozide Thioridazine This medication may also interact with the following: Alcohol Antihistamines for allergy, cough, and cold Atropine Avasimibe Certain antivirals for HIV or hepatitis Certain medications for anxiety or sleep Certain medications for bladder problems, such as oxybutynin, tolterodine Certain medications for depression, such as amitriptyline, fluoxetine, nefazodone, sertraline Certain medications for fungal infections, such as fluconazole, ketoconazole, itraconazole, posaconazole Certain medications for stomach problems, such as dicyclomine, hyoscyamine Certain medications for travel sickness, such as scopolamine Cimetidine General anesthetics, such as halothane, isoflurane, methoxyflurane, propofol Ipratropium Levodopa or other medications for Parkinson disease Medications for blood pressure Medications for seizures Medications that relax muscles for surgery Opioid medications for pain Other medications that cause heart rhythm changes Phenothiazines, such as chlorpromazine, prochlorperazine Rifampin St. John's wort This list may not describe all possible interactions. Give your health care provider a list of all the medicines, herbs, non-prescription drugs, or dietary supplements you use. Also tell them if you smoke, drink alcohol, or use illegal drugs. Some items may interact with your medicine. What should I watch for  while using this medication? Visit your care team for regular  checks on your progress. Tell your care team if your symptoms do not start to get better or if they get worse. Do not suddenly stop taking This medication. You may develop a severe reaction. Your care team will tell you how much medication to take. If your care team wants you to stop the medication, the dose may be slowly lowered over time to avoid any side effects. You may need to have an eye exam before and during use of this medication. This medication may increase blood sugar. Ask your care team if changes in diet or medications are needed if you have diabetes. This medication may cause thoughts of suicide or depression. This includes sudden changes in mood, behaviors, or thoughts. These changes can happen at any time but are more common in the beginning of treatment or after a change in dose. Call your care team right away if you experience these thoughts or worsening depression. This medication may affect your coordination, reaction time, or judgment. Do not drive or operate machinery until you know how this medication affects you. Sit up or stand slowly to reduce the risk of dizzy or fainting spells. Drinking alcohol with this medication can increase the risk of these side effects. This medication can cause problems with controlling your body temperature. It can lower the response of your body to cold temperatures. If possible, stay indoors during cold weather. If you must go outdoors, wear warm clothes. It can also lower the response of your body to heat. Do not overheat. Do not over-exercise. Stay out of the sun when possible. If you must be in the sun, wear cool clothing. Drink plenty of water. If you have trouble controlling your body temperature, call your care team right away. What side effects may I notice from receiving this medication? Side effects that you should report to your care team as soon as possible: Allergic  reactions--skin rash, itching, hives, swelling of the face, lips, tongue, or throat Heart rhythm changes--fast or irregular heartbeat, dizziness, feeling faint or lightheaded, chest pain, trouble breathing High blood sugar (hyperglycemia)--increased thirst or amount of urine, unusual weakness or fatigue, blurry vision High fever, stiff muscles, increased sweating, fast or irregular heartbeat, and confusion, which may be signs of neuroleptic malignant syndrome High prolactin level--unexpected breast tissue growth, discharge from the nipple, change in sex drive or performance, irregular menstrual cycle Increase in blood pressure in children Infection--fever, chills, cough, or sore throat Low blood pressure--dizziness, feeling faint or lightheaded, blurry vision Low thyroid levels (hypothyroidism)--unusual weakness or fatigue, increased sensitivity to cold, constipation, hair loss, dry skin, weight gain, feelings of depression Pain or trouble swallowing Seizures Stroke--sudden numbness or weakness of the face, arm, or leg, trouble speaking, confusion, trouble walking, loss of balance or coordination, dizziness, severe headache, change in vision Sudden eye pain or change in vision such as blurry vision, seeing halos around lights, vision loss Thoughts of suicide or self-harm, worsening mood, feelings of depression Trouble passing urine Uncontrolled and repetitive body movements, muscle stiffness or spasms, tremors or shaking, loss of balance or coordination, restlessness, shuffling walk, which may be signs of extrapyramidal symptoms (EPS) Side effects that usually do not require medical attention (report to your care team if they continue or are bothersome): Constipation Dizziness Drowsiness Dry mouth Weight gain This list may not describe all possible side effects. Call your doctor for medical advice about side effects. You may report side effects to FDA at 1-800-FDA-1088. Where should I keep my  medication? Keep out  of the reach of children. Store at room temperature between 15 and 30 degrees C (59 and 86 degrees F). Throw away any unused medication after the expiration date. NOTE: This sheet is a summary. It may not cover all possible information. If you have questions about this medicine, talk to your doctor, pharmacist, or health care provider.  2024 Elsevier/Gold Standard (2021-09-01 00:00:00)Preventing Hypertension Hypertension, also called high blood pressure, is when the force of blood pumping through the arteries is too strong. Arteries are blood vessels that carry blood from the heart throughout the body. Often, hypertension does not cause symptoms until blood pressure is very high. It is important to have your blood pressure checked regularly. Diet and lifestyle changes can help you prevent hypertension, and they may make you feel better overall and improve your quality of life. If you already have hypertension, you may control it with diet and lifestyle changes, as well as with medicine. How can this condition affect me? Over time, hypertension can damage the arteries and decrease blood flow to important parts of the body, including the brain, heart, and kidneys. By keeping your blood pressure in a healthy range, you can help prevent complications like heart attack, heart failure, stroke, kidney failure, and vascular dementia. What can increase my risk? An unhealthy diet and a lack of physical activity can make you more likely to develop high blood pressure. Some other risk factors include: Age. The risk increases with age. Having family members who have had high blood pressure. Having certain health conditions, such as thyroid problems. Being overweight or obese. Drinking too much alcohol or caffeine. Having too much fat, sugar, calories, or salt (sodium) in your diet. Smoking or using illegal drugs. Taking certain medicines, such as antidepressants, decongestants, birth  control pills, and NSAIDs, such as ibuprofen. What actions can I take to prevent or manage this condition? Work with your health care provider to make a hypertension prevention plan that works for you. You may be referred for counseling on a healthy diet and physical activity. Follow your plan and keep all follow-up visits. Diet changes Maintain a healthy diet. This includes: Eating less salt (sodium). Ask your health care provider how much sodium is safe for you to have. The general recommendation is to have less than 1 tsp (2,300 mg) of sodium a day. Do not add salt to your food. Choose low-sodium options when grocery shopping and eating out. Limiting fats in your diet. You can do this by eating low-fat or fat-free dairy products and by eating less red meat. Eating more fruits, vegetables, and whole grains. Make a goal to eat: 1-2 cups of fresh fruits and vegetables each day. 3-4 servings of whole grains each day. Avoiding foods and beverages that have added sugars. Eating fish that contain healthy fats (omega-3 fatty acids), such as mackerel or salmon. If you need help putting together a healthy eating plan, try the DASH diet. This diet is high in fruits, vegetables, and whole grains. It is low in sodium, red meat, and added sugars. DASH stands for Dietary Approaches to Stop Hypertension. Lifestyle changes  Lose weight if you are overweight. Losing just 3-5% of your body weight can help prevent or control hypertension. For example, if your present weight is 200 lb (91 kg), a loss of 3-5% of your weight means losing 6-10 lb (2.7-4.5 kg). Ask your health care provider to help you with a diet and exercise plan to safely lose weight. Get enough exercise. Do at least  150 minutes of moderate-intensity exercise each week. You could do this in short exercise sessions several times a day, or you could do longer exercise sessions a few times a week. For example, you could take a brisk 10-minute walk or  bike ride, 3 times a day, for 5 days a week. Find ways to reduce stress, such as exercising, meditating, listening to music, or taking a yoga class. If you need help reducing stress, ask your health care provider. Do not use any products that contain nicotine or tobacco. These products include cigarettes, chewing tobacco, and vaping devices, such as e-cigarettes. Chemicals in tobacco and nicotine products raise your blood pressure each time you use them. If you need help quitting, ask your health care provider. Learn how to check your blood pressure at home. Make sure that you know your personal target blood pressure, as told by your health care provider. Try to sleep 7-9 hours per night. Alcohol use Do not drink alcohol if: Your health care provider tells you not to drink. You are pregnant, may be pregnant, or are planning to become pregnant. If you drink alcohol: Limit how much you have to: 0-1 drink a day for women. 0-2 drinks a day for men. Know how much alcohol is in your drink. In the [place] one drink equals one 12 oz bottle of beer (355 mL), one 5 oz glass of wine (148 mL), or one 1 oz glass of hard liquor (44 mL). Medicines In addition to diet and lifestyle changes, your health care provider may recommend medicines to help lower your blood pressure. In general: You may need to try a few different medicines to find what works best for you. You may need to take more than one medicine. Take over-the-counter and prescription medicines only as told by your health care provider. Questions to ask your health care provider What is my blood pressure goal? How can I lower my risk for high blood pressure? How should I monitor my blood pressure at home? Where to find support Your health care provider can help you prevent hypertension and help you keep your blood pressure at a healthy level. Your local hospital or your community may also provide support services and prevention programs. The  American Heart Association offers an online support network at supportnetwork.heart.org Where to find more information Learn more about hypertension from: National Heart, Lung, and Blood Institute: PopSteam.is Centers for Disease Control and Prevention: FootballExhibition.com.br American Academy of Family Physicians: familydoctor.org Learn more about the DASH diet from: National Heart, Lung, and Blood Institute: PopSteam.is Contact a health care provider if: You think you are having a reaction to medicines you have taken. You have recurrent headaches or feel dizzy. You have swelling in your ankles. You have trouble with your vision. Get help right away if: You have sudden, severe chest, back, or abdominal pain or discomfort. You have shortness of breath. You have a sudden, severe headache. These symptoms may be an emergency. Get help right away. Call 911. Do not wait to see if the symptoms will go away. Do not drive yourself to the hospital. Summary Hypertension often does not cause any symptoms until blood pressure is very high. It is important to get your blood pressure checked regularly. Diet and lifestyle changes are important steps in preventing hypertension. By keeping your blood pressure in a healthy range, you may prevent complications like heart attack, heart failure, stroke, and kidney failure. Work with your health care provider to make a hypertension prevention plan  that works for you. This information is not intended to replace advice given to you by your health care provider. Make sure you discuss any questions you have with your health care provider. Document Revised: 12/05/2020 Document Reviewed: 12/05/2020 Elsevier Patient Education  2024 [employer].

## 2023-06-07 NOTE — Progress Notes (Unsigned)
 New Patient Office Visit  Subjective    Patient ID: Morgan Morales female  DOB: 1990-08-28  Age: 33 y.o. MRN: [id number]   CC:  Wrist pain  Depression and anxiety  HPI     New Patient (Initial Visit)    Additional comments: Pt is needing a referral to obgyn for nexaplon removal and having issues with carpal tunnel       Last edited by Herbert Deaner, RMA on 06/07/2023  3:26 PM.      HPI  Morgan Morales is a 33 year old female in today to establish care. Initially aggressive she calmed down after venting.  Her main concern is she has risk pain in her right arm. Requesting oxycodone for pain. She is negative for Phalen and Tinel syndrome.  States at night her fingers curl up and she has to open them up by using the other hand to get relief and for them to straighten out. Requesting xanax for anxiety, depression and sleep.  Explained to patient do not prescribe narcotics or benzos.  She states she is in a great deal of pain and has been taking Tylenol and ibuprofen without any relief.  She also suffers from anxiety and depression from the loss of her mother. Has a hx of taking narcotics feels like she is immune. Stress about bills and court. Current Outpatient Medications on File Prior to Visit  Medication Sig Dispense Refill   acetaminophen (TYLENOL) 325 MG tablet Take 2 tablets (650 mg total) by mouth every 6 (six) hours as needed. 30 tablet 0   doxycycline (VIBRAMYCIN) 100 MG capsule Take 1 capsule (100 mg total) by mouth 2 (two) times daily. 20 capsule 0   gabapentin (NEURONTIN) 100 MG capsule Take 1 capsule (100 mg total) by mouth 3 (three) times daily as needed. 60 capsule 0   ibuprofen (ADVIL,MOTRIN) 800 MG tablet Take 1 tablet (800 mg total) by mouth 3 (three) times daily. 21 tablet 0   metroNIDAZOLE (FLAGYL) 500 MG tablet Take 1 tablet (500 mg total) by mouth 2 (two) times daily. 14 tablet 0   naproxen (NAPROSYN) 500 MG tablet Take 1 tablet (500 mg total) by mouth  2 (two) times daily. 60 tablet 0   triamcinolone cream (KENALOG) 0.1 % Apply 1 Application topically 2 (two) times daily. 30 g 0   No current facility-administered medications on file prior to visit.     No Known Allergies  History reviewed. No pertinent past medical history.   History reviewed. No pertinent surgical history.   Family History  Problem Relation Age of Onset   Hypertension Mother    Diabetes Mother     Social History   Socioeconomic History   Marital status: Single    Spouse name: Not on file   Number of children: Not on file   Years of education: Not on file   Highest education level: 12th grade  Occupational History   Not on file  Tobacco Use   Smoking status: Every Day    Current packs/day: 0.50    Types: Cigarettes   Smokeless tobacco: Never   Tobacco comments:    8 cig/day  Substance and Sexual Activity   Alcohol use: Yes    Alcohol/week: 0.0 standard drinks of alcohol    Comment: occ   Drug use: No   Sexual activity: Yes    Partners: Female    Birth control/protection: None  Other Topics Concern   Not on file  Social History Narrative   **  Merged History Encounter **       Social Drivers of Health   Financial Resource Strain: High Risk (12/21/2022)   Overall Financial Resource Strain (CARDIA)    Difficulty of Paying Living Expenses: Very hard  Food Insecurity: Food Insecurity Present (12/21/2022)   Hunger Vital Sign    Worried About Running Out of Food in the Last Year: Often true    Ran Out of Food in the Last Year: Often true  Transportation Needs: Unmet Transportation Needs (12/21/2022)   PRAPARE - Administrator, Civil Service (Medical): Yes    Lack of Transportation (Non-Medical): Yes  Physical Activity: Unknown (12/21/2022)   Exercise Vital Sign    Days of Exercise per Week: 0 days    Minutes of Exercise per Session: Not on file  Stress: Stress Concern Present (12/21/2022)   [employer] of Occupational  Health - Occupational Stress Questionnaire    Feeling of Stress : Very much  Social Connections: Socially Isolated (12/21/2022)   Social Connection and Isolation Panel [NHANES]    Frequency of Communication with Friends and Family: Once a week    Frequency of Social Gatherings with Friends and Family: Never    Attends Religious Services: 1 to 4 times per year    Active Member of [employer] or Organizations: No    Attends Engineer, structural: Not on file    Marital Status: Never married  Intimate Partner Violence: Not on file   Health Maintenance  Topic Date Due   Pneumococcal Vaccination (1 of 2 - PCV) Never done   DTaP/Tdap/Td vaccine (1 - Tdap) Never done   Pap with HPV screening  09/15/2021   COVID-19 Vaccine (1 - 2024-25 season) Never done   Flu Shot  10/01/2023   Hepatitis C Screening  Completed   HIV Screening  Completed   HPV Vaccine  Aged Out    Objective   BP 130/88 (Cuff Size: Normal)   Resp 16   Ht 5\' 3"  (1.6 m)   Wt 134 lb 6.4 oz (61 kg)   SpO2 98%   BMI 23.81 kg/m   BP 130/88 (Cuff Size: Normal)   Resp 16   Ht 5\' 3"  (1.6 m)   Wt 134 lb 6.4 oz (61 kg)   SpO2 98%   BMI 23.81 kg/m   Physical Exam Vitals reviewed.  Constitutional:      Appearance: Normal appearance.  HENT:     Head: Normocephalic.     Right Ear: Tympanic membrane, ear canal and external ear normal.     Left Ear: Tympanic membrane, ear canal and external ear normal.     Nose: Nose normal.     Mouth/Throat:     Mouth: Mucous membranes are moist.  Eyes:     Extraocular Movements: Extraocular movements intact.     Pupils: Pupils are equal, round, and reactive to light.  Cardiovascular:     Rate and Rhythm: Normal rate.  Pulmonary:     Effort: Pulmonary effort is normal.     Breath sounds: Normal breath sounds.  Abdominal:     General: Bowel sounds are normal.     Palpations: Abdomen is soft.  Musculoskeletal:     Cervical back: Normal range of motion.     Comments: Wrist pain  left > right   Skin:    General: Skin is warm and dry.  Neurological:     Mental Status: She is alert and oriented to person, place, and time.  Psychiatric:        Mood and Affect: Mood normal.        Behavior: Behavior normal.        Thought Content: Thought content normal.    Assessment & Plan:  Domanique was seen today for new patient (initial visit).  Diagnoses and all orders for this visit:  Pain in both wrists -     Ambulatory referral to Pain Clinic -     Ambulatory referral to Orthopedic Surgery  Adjustment disorder with mixed anxiety and depressed mood See HPI -     QUEtiapine (SEROQUEL) 25 MG tablet; Take 1 tablet (25 mg total) by mouth at bedtime.  Other insomnia -     QUEtiapine (SEROQUEL) 25 MG tablet; Take 1 tablet (25 mg total) by mouth at bedtime.  Encounter to establish care  Nexplanon removal Refer to GYN   Follow up 07/20/23 medication effectiveness  The above assessment and management plan was discussed with the patient. The patient verbalized understanding of and has agreed to the management plan. Patient is aware to call the clinic if symptoms fail to improve or worsen. Patient is aware when to return to the clinic for a follow-up visit. Patient educated on when it is appropriate to go to the emergency department.   Gwinda Passe, NP-C

## 2023-06-08 ENCOUNTER — Encounter (INDEPENDENT_AMBULATORY_CARE_PROVIDER_SITE_OTHER): Payer: Self-pay | Admitting: Primary Care

## 2023-06-11 ENCOUNTER — Other Ambulatory Visit (INDEPENDENT_AMBULATORY_CARE_PROVIDER_SITE_OTHER): Payer: Self-pay

## 2023-06-11 ENCOUNTER — Encounter: Payer: Self-pay | Admitting: Physician Assistant

## 2023-06-11 ENCOUNTER — Ambulatory Visit: Admitting: Physician Assistant

## 2023-06-11 DIAGNOSIS — M79642 Pain in left hand: Secondary | ICD-10-CM

## 2023-06-11 DIAGNOSIS — M79641 Pain in right hand: Secondary | ICD-10-CM

## 2023-06-11 MED ORDER — MELOXICAM 15 MG PO TABS: 15.0000 mg | ORAL_TABLET | Freq: Every day | ORAL | 0 refills | Status: DC

## 2023-06-11 NOTE — Progress Notes (Signed)
 Office Visit Note   Patient: Morgan Morales           Date of Birth: April 17, 1990           MRN: [id number] Visit Date: 06/11/2023              Requested by: Grayce Sessions, NP [address] [place],  [place] [address] PCP: System, Provider Not In   Assessment & Plan: Visit Diagnoses:  1. Pain in right hand   2. Pain in left hand     Plan: Pleasant 33 year old woman with a long history of bilateral hand pain.  She did have jobs where she used her hands quite a bit.'s been going on 5 years.  She is never really had it examined until recently.  She denies any neck pain.  Examination today she does have some mild positive Finkelstein's test on the left and on the right she has pain reproduced over the ulnar side of her hand with Finkelstein's testing.  She has good strength good opposition she got strong pulses we will try some conservative treatment initially with hand therapy for tendinitis for both of her hands also will give her a thumb spica splint which she requested for the right side.  She will stop her ibuprofen and get on a regular course of meloxicam  Follow-Up Instructions: No follow-ups on file.   Orders:  Orders Placed This Encounter  Procedures   XR Hand Complete Right   XR Hand Complete Left   No orders of the defined types were placed in this encounter.     Procedures: No procedures performed   Clinical Data: No additional findings.   Subjective: No chief complaint on file.   HPI pleasant 33 year old woman with a long history of bilateral hand pain.  She denies any particular injury she has done a lot of stuff with her hands.  She is worried because her mother who recently passed away had a lot of arthritis in her spine and she is concerned she may have arthritis in her hands.  She occasionally gets some diffuse numbing in her hands no neck pain no radicular pain was seen by evaluated felt likely de Quervain's syndrome.  Review of Systems   All other systems reviewed and are negative.    Objective: Vital Signs: There were no vitals taken for this visit.  Physical Exam Constitutional:      Appearance: Normal appearance.  Pulmonary:     Effort: Pulmonary effort is normal.  Skin:    General: Skin is warm and dry.  Neurological:     General: No focal deficit present.     Mental Status: She is alert and oriented to person, place, and time.     Ortho Exam  Specialty Comments:  No specialty comments available.  Imaging: No results found.   PMFS History: Patient Active Problem List   Diagnosis Date Noted   MDD (major depressive disorder), recurrent episode, moderate (HCC) 12/21/2022   Passive suicidal ideations 12/21/2022   Tinnitus of both ears 12/21/2022   Lower urinary tract infectious disease 07/01/2017   Encounter for initial prescription of Nexplanon 09/19/2015   MENSTRUATION, PAINFUL 04/29/2006   ACNE 04/29/2006   History reviewed. No pertinent past medical history.  Family History  Problem Relation Age of Onset   Hypertension Mother    Diabetes Mother     History reviewed. No pertinent surgical history. Social History   Occupational History   Not on file  Tobacco Use   Smoking status: Every Day    Current packs/day: 0.50    Types: Cigarettes   Smokeless tobacco: Never   Tobacco comments:    8 cig/day  Substance and Sexual Activity   Alcohol use: Yes    Alcohol/week: 0.0 standard drinks of alcohol    Comment: occ   Drug use: No   Sexual activity: Yes    Partners: Female    Birth control/protection: None

## 2023-06-11 NOTE — Addendum Note (Signed)
 Addended by: Michaele Offer on: 06/11/2023 02:46 PM   Modules accepted: Orders

## 2023-06-23 ENCOUNTER — Ambulatory Visit: Payer: Self-pay

## 2023-06-23 ENCOUNTER — Other Ambulatory Visit (INDEPENDENT_AMBULATORY_CARE_PROVIDER_SITE_OTHER): Payer: Self-pay | Admitting: Primary Care

## 2023-06-23 DIAGNOSIS — F4323 Adjustment disorder with mixed anxiety and depressed mood: Secondary | ICD-10-CM

## 2023-06-23 DIAGNOSIS — G4709 Other insomnia: Secondary | ICD-10-CM

## 2023-06-23 NOTE — Telephone Encounter (Signed)
 Patient called in stating that she had new patient appt with Madelyn Schick NP and was referred to orthopaedic doctor for pain in wrists and hands. Patient states she was given Meloxicam  for pain control but it is not helping at all. Patient is asking if PCP can prescribe something additional for pain. Please advise.   Copied from CRM [id number]. Topic: Clinical - Red Word Triage >> Jun 23, 2023  2:42 PM Everette C wrote: [facility] that prompted transfer to Nurse Triage: The patient is experiencing significant discomfort, swelling and mobility issues in their right hand Reason for Disposition  [1] Follow-up call from patient regarding patient's clinical status AND [2] information NON-URGENT  Answer Assessment - Initial Assessment Questions 1. REASON FOR CALL or QUESTION: "What is your reason for calling today?" or "How can I best help you?" or "What question do you have that I can help answer?"     Please see notes 2. CALLER: Document the source of call. (e.g., laboratory, patient).     Patient  Protocols used: PCP Call - No Triage-A-AH

## 2023-06-24 ENCOUNTER — Other Ambulatory Visit (HOSPITAL_BASED_OUTPATIENT_CLINIC_OR_DEPARTMENT_OTHER): Payer: Self-pay

## 2023-06-24 MED ORDER — MELOXICAM 15 MG PO TABS: 15.0000 mg | ORAL_TABLET | Freq: Every day | ORAL | 0 refills | Status: DC

## 2023-06-28 ENCOUNTER — Ambulatory Visit: Attending: Physician Assistant | Admitting: Occupational Therapy

## 2023-07-15 ENCOUNTER — Telehealth (INDEPENDENT_AMBULATORY_CARE_PROVIDER_SITE_OTHER): Payer: Self-pay | Admitting: Primary Care

## 2023-07-15 NOTE — Telephone Encounter (Signed)
 Called pt to confirm appt. Pt will be present and also mentioned she can not sleep and is out of Seroquel . Pt just wanted to know if this prescription can be refilled.

## 2023-07-16 ENCOUNTER — Encounter (INDEPENDENT_AMBULATORY_CARE_PROVIDER_SITE_OTHER): Payer: Self-pay

## 2023-07-16 NOTE — Telephone Encounter (Signed)
 My Chart message sent

## 2023-07-16 NOTE — Telephone Encounter (Signed)
 Please reach out to pt and make aware that she has refills on it she will need to reach out to pharmacy

## 2023-07-19 ENCOUNTER — Telehealth (INDEPENDENT_AMBULATORY_CARE_PROVIDER_SITE_OTHER): Payer: Self-pay | Admitting: Primary Care

## 2023-07-19 NOTE — Telephone Encounter (Signed)
 Spoke to pt about upcoming appt.. Will be present

## 2023-07-20 ENCOUNTER — Ambulatory Visit (INDEPENDENT_AMBULATORY_CARE_PROVIDER_SITE_OTHER): Admitting: Primary Care

## 2023-07-20 ENCOUNTER — Telehealth (INDEPENDENT_AMBULATORY_CARE_PROVIDER_SITE_OTHER): Payer: Self-pay | Admitting: Primary Care

## 2023-07-20 NOTE — Telephone Encounter (Signed)
 Called pt to reschedule appt. Pt did not answer and could not LVM.

## 2023-07-22 NOTE — Therapy (Deleted)
 OUTPATIENT OCCUPATIONAL THERAPY ORTHO EVALUATION  Patient Name: Morgan Morales MRN: [id number] DOB:25-Nov-1990, 33 y.o., female Today's Date: 07/22/2023  PCP: Aaron Aas REFERRING PROVIDER: Persons, Norma Beckers, [place]  END OF SESSION:   No past medical history on file. No past surgical history on file. Patient Active Problem List   Diagnosis Date Noted   MDD (major depressive disorder), recurrent episode, moderate (HCC) 12/21/2022   Passive suicidal ideations 12/21/2022   Tinnitus of both ears 12/21/2022   Lower urinary tract infectious disease 07/01/2017   Encounter for initial prescription of Nexplanon  09/19/2015   MENSTRUATION, PAINFUL 04/29/2006   ACNE 04/29/2006    ONSET DATE: ***  REFERRING DIAG: ***  THERAPY DIAG:  No diagnosis found.  Rationale for Evaluation and Treatment: {HABREHAB:27488}  SUBJECTIVE:   SUBJECTIVE STATEMENT: *** Pt accompanied by: {accompnied:27141}  PERTINENT HISTORY: ***  PRECAUTIONS: {Therapy precautions:24002}  RED FLAGS: {PT Red Flags:29287}   WEIGHT BEARING RESTRICTIONS: {Yes ***/No:24003}  PAIN:  Are you having pain? {OPRCPAIN:27236}  FALLS: Has patient fallen in last 6 months? {fallsyesno:27318}  LIVING ENVIRONMENT: Lives with: {OPRC lives with:25569::"lives with their family"} Lives in: {Lives in:25570} Stairs: {opstairs:27293} Has following equipment at home: {Assistive devices:23999}  PLOF: {PLOF:24004}  PATIENT GOALS: ***  NEXT MD VISIT: ***  OBJECTIVE:  Note: Objective measures were completed at Evaluation unless otherwise noted.  HAND DOMINANCE: {MISC; OT HAND DOMINANCE:5863276404}  ADLs: {ADLs OT:31716}  FUNCTIONAL OUTCOME MEASURES: {OTFUNCTIONALMEASURES:27238}  UPPER EXTREMITY ROM:     {AROM/PROM:27142} ROM Right eval Left eval  Shoulder flexion    Shoulder abduction    Shoulder adduction    Shoulder extension    Shoulder internal rotation    Shoulder external rotation    Elbow flexion    Elbow  extension    Wrist flexion    Wrist extension    Wrist ulnar deviation    Wrist radial deviation    Wrist pronation    Wrist supination    (Blank rows = not tested)  {AROM/PROM:27142} ROM Right eval Left eval  Thumb MCP (0-60)    Thumb IP (0-80)    Thumb Radial abd/add (0-55)     Thumb Palmar abd/add (0-45)     Thumb Opposition to Small Finger     Index MCP (0-90)     Index PIP (0-100)     Index DIP (0-70)      Long MCP (0-90)      Long PIP (0-100)      Long DIP (0-70)      Ring MCP (0-90)      Ring PIP (0-100)      Ring DIP (0-70)      Little MCP (0-90)      Little PIP (0-100)      Little DIP (0-70)      (Blank rows = not tested)   UPPER EXTREMITY MMT:     MMT Right eval Left eval  Shoulder flexion    Shoulder abduction    Shoulder adduction    Shoulder extension    Shoulder internal rotation    Shoulder external rotation    Middle trapezius    Lower trapezius    Elbow flexion    Elbow extension    Wrist flexion    Wrist extension    Wrist ulnar deviation    Wrist radial deviation    Wrist pronation    Wrist supination    (Blank rows = not tested)  HAND FUNCTION: {handfunction:27230}  COORDINATION: {otcoordination:27237}  SENSATION: {sensation:27233}  EDEMA: ***  COGNITION: Overall cognitive status: {cognition:24006} Areas of impairment: {impairedcognition:27234}  OBSERVATIONS: ***   TREATMENT DATE: ***                                                                                                                            Modalities: {OPRCMODALITIES:31717}     PATIENT EDUCATION: Education details: *** Person educated: {Person educated:25204} Education method: {Education Method:25205} Education comprehension: {Education Comprehension:25206}  HOME EXERCISE PROGRAM: ***  GOALS: Goals reviewed with patient? {yes/no:20286}  SHORT TERM GOALS: Target date: ***  *** Baseline: Goal status: INITIAL  2.  *** Baseline:  Goal  status: INITIAL  3.  *** Baseline:  Goal status: INITIAL  4.  *** Baseline:  Goal status: INITIAL  5.  *** Baseline:  Goal status: INITIAL  6.  *** Baseline:  Goal status: INITIAL  LONG TERM GOALS: Target date: ***  *** Baseline:  Goal status: INITIAL  2.  *** Baseline:  Goal status: INITIAL  3.  *** Baseline:  Goal status: INITIAL  4.  *** Baseline:  Goal status: INITIAL  5.  *** Baseline:  Goal status: INITIAL  6.  *** Baseline:  Goal status: INITIAL  ASSESSMENT:  CLINICAL IMPRESSION: Patient is a *** y.o. *** who was seen today for occupational therapy evaluation for ***.   PERFORMANCE DEFICITS: in functional skills including {OT physical skills:25468}, cognitive skills including {OT cognitive skills:25469}, and psychosocial skills including {OT psychosocial skills:25470}.   IMPAIRMENTS: are limiting patient from {OT performance deficits:25471}.   COMORBIDITIES: {Comorbidities:25485} that affects occupational performance. Patient will benefit from skilled OT to address above impairments and improve overall function.  MODIFICATION OR ASSISTANCE TO COMPLETE EVALUATION: {OT modification:25474}  OT OCCUPATIONAL PROFILE AND HISTORY: {OT PROFILE AND HISTORY:25484}  CLINICAL DECISION MAKING: {OT CDM:25475}  REHAB POTENTIAL: {rehabpotential:25112}  EVALUATION COMPLEXITY: {Evaluation complexity:25115}      PLAN:  OT FREQUENCY: {rehab frequency:25116}  OT DURATION: {rehab duration:25117}  PLANNED INTERVENTIONS: {OT Interventions:25467}  RECOMMENDED OTHER SERVICES: ***  CONSULTED AND AGREED WITH PLAN OF CARE: {YQM:57846}  PLAN FOR NEXT SESSION: Altamease Asters, OT 07/22/2023, 5:56 PM

## 2023-07-23 ENCOUNTER — Ambulatory Visit: Attending: Physician Assistant | Admitting: Occupational Therapy

## 2023-09-23 ENCOUNTER — Encounter: Payer: Self-pay | Admitting: Obstetrics and Gynecology

## 2023-09-23 ENCOUNTER — Encounter: Admitting: Obstetrics and Gynecology

## 2023-09-24 NOTE — Progress Notes (Signed)
 Patient did not keep her GYN appointment for 09/23/2023.  Bebe Izell Raddle MD Attending [facility] [employer] [facility])

## 2023-09-28 ENCOUNTER — Telehealth (INDEPENDENT_AMBULATORY_CARE_PROVIDER_SITE_OTHER): Payer: Self-pay | Admitting: Primary Care

## 2023-09-28 ENCOUNTER — Other Ambulatory Visit (INDEPENDENT_AMBULATORY_CARE_PROVIDER_SITE_OTHER): Payer: Self-pay | Admitting: Primary Care

## 2023-09-28 DIAGNOSIS — G4709 Other insomnia: Secondary | ICD-10-CM

## 2023-09-28 DIAGNOSIS — F4323 Adjustment disorder with mixed anxiety and depressed mood: Secondary | ICD-10-CM

## 2023-09-28 NOTE — Telephone Encounter (Unsigned)
 Copied from CRM [id number]. Topic: Clinical - Medication Refill >> Sep 28, 2023 11:25 AM Tiffini S wrote: Medication: QUEtiapine  (SEROQUEL ) 25 MG tablet  Has the patient contacted their pharmacy? No (Agent: If no, request that the patient contact the pharmacy for the refill. If patient does not wish to contact the pharmacy document the reason why and proceed with request.) (Agent: If yes, when and what did the pharmacy advise?)  This is the patient's preferred pharmacy:  [facility] - [place], [place] - [address] [place] [address] Phone: [phone] Fax: [phone]  Is this the correct pharmacy for this prescription? Yes If no, delete pharmacy and type the correct one.   Has the prescription been filled recently? Yes  Is the patient out of the medication? Yes  Has the patient been seen for an appointment in the last year OR does the patient have an upcoming appointment? Yes  Can we respond through MyChart? Yes  Agent: Please be advised that Rx refills may take up to 3 business days. We ask that you follow-up with your pharmacy.

## 2023-09-28 NOTE — Telephone Encounter (Signed)
 Copied from CRM [id number]. Topic: Clinical - Medication Prior Auth >> Sep 28, 2023  3:07 PM Sophia H wrote: Reason for CRM: Patient states received a call from the pharmacy stating that QUEtiapine  (SEROQUEL ) 25 MG tablet is needing a prior authorization. Please advise, Ty   [facility] - [place], [place] - [address]

## 2023-09-29 ENCOUNTER — Telehealth (INDEPENDENT_AMBULATORY_CARE_PROVIDER_SITE_OTHER): Payer: Self-pay | Admitting: Primary Care

## 2023-09-29 NOTE — Telephone Encounter (Signed)
 Requested medication (s) are due for refill today -yes  Requested medication (s) are on the active medication list -yes  Future visit scheduled -no  Last refill: 06/07/23 #90  Notes to clinic: non delegated Rx  Requested Prescriptions  Pending Prescriptions Disp Refills   QUEtiapine  (SEROQUEL ) 25 MG tablet 90 tablet 0    Sig: Take 1 tablet (25 mg total) by mouth at bedtime.     Not Delegated - Psychiatry:  Antipsychotics - Second Generation (Atypical) - quetiapine  Failed - 09/29/2023 12:49 PM      Failed - This refill cannot be delegated      Failed - TSH in normal range and within 360 days    TSH  Date Value Ref Range Status  09/04/2015 0.486 0.450 - 4.500 uIU/mL Final         Failed - Completed PHQ-2 or PHQ-9 in the last 360 days      Failed - Lipid Panel in normal range within the last 12 months    No results found for: CHOL, POCCHOL, CHOLTOT No results found for: LDLCALC, LDLC, HIRISKLDL, POCLDL, LDLDIRECT, REALLDLC, TOTLDLC No results found for: HDL, POCHDL No results found for: TRIG, POCTRIG       Passed - Last BP in normal range    BP Readings from Last 1 Encounters:  06/07/23 130/88         Passed - Last Heart Rate in normal range    Pulse Readings from Last 1 Encounters:  03/04/23 74         Passed - Valid encounter within last 6 months    Recent Outpatient Visits           3 months ago Encounter to establish care   [facility] Morgan Rosaline SQUIBB, NP              Passed - CBC within normal limits and completed in the last 12 months    WBC  Date Value Ref Range Status  03/04/2023 8.4 4.0 - 10.5 K/uL Final   RBC  Date Value Ref Range Status  03/04/2023 3.89 3.87 - 5.11 MIL/uL Final   Hemoglobin  Date Value Ref Range Status  03/04/2023 12.0 12.0 - 15.0 g/dL Final  92/94/7982 84.3 11.1 - 15.9 g/dL Final   HCT  Date Value Ref Range Status  03/04/2023 36.6 36.0 - 46.0 % Final    Hematocrit  Date Value Ref Range Status  09/04/2015 47.5 (H) 34.0 - 46.6 % Final   MCHC  Date Value Ref Range Status  03/04/2023 32.8 30.0 - 36.0 g/dL Final   [facility]  Date Value Ref Range Status  03/04/2023 30.8 26.0 - 34.0 pg Final   MCV  Date Value Ref Range Status  03/04/2023 94.1 80.0 - 100.0 fL Final  09/04/2015 95 79 - 97 fL Final   No results found for: PLTCOUNTKUC, LABPLAT, POCPLA RDW  Date Value Ref Range Status  03/04/2023 12.8 11.5 - 15.5 % Final  09/04/2015 13.5 12.3 - 15.4 % Final         Passed - CMP within normal limits and completed in the last 12 months    Albumin  Date Value Ref Range Status  03/04/2023 3.2 (L) 3.5 - 5.0 g/dL Final  92/94/7982 4.3 3.5 - 5.5 g/dL Final   Alkaline Phosphatase  Date Value Ref Range Status  03/04/2023 55 38 - 126 U/L Final   ALT  Date Value Ref Range Status  03/04/2023 26 0 -  44 U/L Final   AST  Date Value Ref Range Status  03/04/2023 49 (H) 15 - 41 U/L Final   BUN  Date Value Ref Range Status  03/04/2023 6 6 - 20 mg/dL Final  92/94/7982 4 (L) 6 - 20 mg/dL Final   Calcium  Date Value Ref Range Status  03/04/2023 8.6 (L) 8.9 - 10.3 mg/dL Final   Calcium, Ion  Date Value Ref Range Status  09/22/2011 1.24 (H) 1.12 - 1.23 mmol/L Final   CO2  Date Value Ref Range Status  03/04/2023 29 22 - 32 mmol/L Final   TCO2  Date Value Ref Range Status  09/22/2011 25 0 - 100 mmol/L Final   Creatinine, Ser  Date Value Ref Range Status  03/04/2023 0.70 0.44 - 1.00 mg/dL Final   Glucose, Bld  Date Value Ref Range Status  03/04/2023 102 (H) 70 - 99 mg/dL Final    Comment:    Glucose reference range applies only to samples taken after fasting for at least 8 hours.   Potassium  Date Value Ref Range Status  03/04/2023 3.7 3.5 - 5.1 mmol/L Final   Sodium  Date Value Ref Range Status  03/04/2023 134 (L) 135 - 145 mmol/L Final  09/04/2015 139 134 - 144 mmol/L Final   Total Bilirubin  Date Value Ref Range  Status  03/04/2023 0.4 0.0 - 1.2 mg/dL Final   Bilirubin Total  Date Value Ref Range Status  09/04/2015 0.3 0.0 - 1.2 mg/dL Final   Protein, ur  Date Value Ref Range Status  03/04/2023 30 (A) NEGATIVE mg/dL Final   Total Protein  Date Value Ref Range Status  03/04/2023 7.6 6.5 - 8.1 g/dL Final  92/94/7982 6.9 6.0 - 8.5 g/dL Final   GFR calc Af Amer  Date Value Ref Range Status  09/04/2015 110 >59 mL/min/1.73 Final   GFR, Estimated  Date Value Ref Range Status  03/04/2023 >60 >60 mL/min Final    Comment:    (NOTE) Calculated using the CKD-EPI Creatinine Equation (2021)             Requested Prescriptions  Pending Prescriptions Disp Refills   QUEtiapine  (SEROQUEL ) 25 MG tablet 90 tablet 0    Sig: Take 1 tablet (25 mg total) by mouth at bedtime.     Not Delegated - Psychiatry:  Antipsychotics - Second Generation (Atypical) - quetiapine  Failed - 09/29/2023 12:49 PM      Failed - This refill cannot be delegated      Failed - TSH in normal range and within 360 days    TSH  Date Value Ref Range Status  09/04/2015 0.486 0.450 - 4.500 uIU/mL Final         Failed - Completed PHQ-2 or PHQ-9 in the last 360 days      Failed - Lipid Panel in normal range within the last 12 months    No results found for: CHOL, POCCHOL, CHOLTOT No results found for: LDLCALC, LDLC, HIRISKLDL, POCLDL, LDLDIRECT, REALLDLC, TOTLDLC No results found for: HDL, POCHDL No results found for: TRIG, POCTRIG       Passed - Last BP in normal range    BP Readings from Last 1 Encounters:  06/07/23 130/88         Passed - Last Heart Rate in normal range    Pulse Readings from Last 1 Encounters:  03/04/23 74         Passed - Valid encounter within last 6 months    Recent  Outpatient Visits           3 months ago Encounter to establish care   [facility] Morgan Rosaline SQUIBB, NP              Passed - CBC within normal limits and  completed in the last 12 months    WBC  Date Value Ref Range Status  03/04/2023 8.4 4.0 - 10.5 K/uL Final   RBC  Date Value Ref Range Status  03/04/2023 3.89 3.87 - 5.11 MIL/uL Final   Hemoglobin  Date Value Ref Range Status  03/04/2023 12.0 12.0 - 15.0 g/dL Final  92/94/7982 84.3 11.1 - 15.9 g/dL Final   HCT  Date Value Ref Range Status  03/04/2023 36.6 36.0 - 46.0 % Final   Hematocrit  Date Value Ref Range Status  09/04/2015 47.5 (H) 34.0 - 46.6 % Final   MCHC  Date Value Ref Range Status  03/04/2023 32.8 30.0 - 36.0 g/dL Final   [facility]  Date Value Ref Range Status  03/04/2023 30.8 26.0 - 34.0 pg Final   MCV  Date Value Ref Range Status  03/04/2023 94.1 80.0 - 100.0 fL Final  09/04/2015 95 79 - 97 fL Final   No results found for: PLTCOUNTKUC, LABPLAT, POCPLA RDW  Date Value Ref Range Status  03/04/2023 12.8 11.5 - 15.5 % Final  09/04/2015 13.5 12.3 - 15.4 % Final         Passed - CMP within normal limits and completed in the last 12 months    Albumin  Date Value Ref Range Status  03/04/2023 3.2 (L) 3.5 - 5.0 g/dL Final  92/94/7982 4.3 3.5 - 5.5 g/dL Final   Alkaline Phosphatase  Date Value Ref Range Status  03/04/2023 55 38 - 126 U/L Final   ALT  Date Value Ref Range Status  03/04/2023 26 0 - 44 U/L Final   AST  Date Value Ref Range Status  03/04/2023 49 (H) 15 - 41 U/L Final   BUN  Date Value Ref Range Status  03/04/2023 6 6 - 20 mg/dL Final  92/94/7982 4 (L) 6 - 20 mg/dL Final   Calcium  Date Value Ref Range Status  03/04/2023 8.6 (L) 8.9 - 10.3 mg/dL Final   Calcium, Ion  Date Value Ref Range Status  09/22/2011 1.24 (H) 1.12 - 1.23 mmol/L Final   CO2  Date Value Ref Range Status  03/04/2023 29 22 - 32 mmol/L Final   TCO2  Date Value Ref Range Status  09/22/2011 25 0 - 100 mmol/L Final   Creatinine, Ser  Date Value Ref Range Status  03/04/2023 0.70 0.44 - 1.00 mg/dL Final   Glucose, Bld  Date Value Ref Range Status   03/04/2023 102 (H) 70 - 99 mg/dL Final    Comment:    Glucose reference range applies only to samples taken after fasting for at least 8 hours.   Potassium  Date Value Ref Range Status  03/04/2023 3.7 3.5 - 5.1 mmol/L Final   Sodium  Date Value Ref Range Status  03/04/2023 134 (L) 135 - 145 mmol/L Final  09/04/2015 139 134 - 144 mmol/L Final   Total Bilirubin  Date Value Ref Range Status  03/04/2023 0.4 0.0 - 1.2 mg/dL Final   Bilirubin Total  Date Value Ref Range Status  09/04/2015 0.3 0.0 - 1.2 mg/dL Final   Protein, ur  Date Value Ref Range Status  03/04/2023 30 (A) NEGATIVE mg/dL Final   Total  Protein  Date Value Ref Range Status  03/04/2023 7.6 6.5 - 8.1 g/dL Final  92/94/7982 6.9 6.0 - 8.5 g/dL Final   GFR calc Af Amer  Date Value Ref Range Status  09/04/2015 110 >59 mL/min/1.73 Final   GFR, Estimated  Date Value Ref Range Status  03/04/2023 >60 >60 mL/min Final    Comment:    (NOTE) Calculated using the CKD-EPI Creatinine Equation (2021)

## 2023-09-29 NOTE — Telephone Encounter (Signed)
 Copied from CRM [id number]. Topic: Clinical - Medication Prior Auth >> Sep 28, 2023  3:07 PM Sophia H wrote: Reason for CRM: Patient states received a call from the pharmacy stating that QUEtiapine  (SEROQUEL ) 25 MG tablet is needing a prior authorization. Please advise, Ty   [facility] - [place], [place] - [address] >> Sep 29, 2023  8:43 AM Montie POUR wrote: Hajer is calling back to check on prior authorization. Please call her or send a message through MyChart when this is sent and when it is received back. Thanks

## 2023-09-29 NOTE — Telephone Encounter (Signed)
 Good Morning [name]  Have you received or able to start a prior auth for pt?

## 2023-09-29 NOTE — Telephone Encounter (Signed)
Already being worked on

## 2023-09-30 MED ORDER — QUETIAPINE FUMARATE 25 MG PO TABS: 25.0000 mg | ORAL_TABLET | Freq: Every day | ORAL | 0 refills | Status: DC

## 2023-10-21 ENCOUNTER — Encounter (HOSPITAL_COMMUNITY): Payer: Self-pay

## 2023-10-21 ENCOUNTER — Ambulatory Visit (HOSPITAL_COMMUNITY)
Admission: EM | Admit: 2023-10-21 | Discharge: 2023-10-21 | Disposition: A | Discharge status: Home / Self Care | Attending: Family Medicine | Admitting: Family Medicine

## 2023-10-21 DIAGNOSIS — Z3202 Encounter for pregnancy test, result negative: Secondary | ICD-10-CM | POA: Diagnosis not present

## 2023-10-21 DIAGNOSIS — N898 Other specified noninflammatory disorders of vagina: Secondary | ICD-10-CM | POA: Diagnosis not present

## 2023-10-21 LAB — POCT URINE PREGNANCY: Preg Test, Ur: NEGATIVE

## 2023-10-21 MED ORDER — IBUPROFEN 800 MG PO TABS
800.0000 mg | ORAL_TABLET | Freq: Three times a day (TID) | ORAL | 0 refills | Status: AC
Start: 2023-10-21 — End: ?

## 2023-10-21 MED ORDER — ACETAMINOPHEN 325 MG PO TABS: 650.0000 mg | ORAL_TABLET | Freq: Once | ORAL | Status: DC

## 2023-10-21 NOTE — Discharge Instructions (Addendum)
 Urine pregnancy is NEGATIVE.   STD testing pending, this will take 2-3 days to result. We will only call you if your testing is positive for any infection(s) and we will provide treatment.  Avoid sexual intercourse until your STD results come back.  If any of your STD results are positive, you will need to avoid sexual intercourse for 7 days while you are being treated to prevent spread of STD.  Condom use is the best way to prevent spread of STDs. Notify partner(s) of any positive results.  Return to urgent care as needed.

## 2023-10-21 NOTE — ED Triage Notes (Signed)
 Pt c/o vaginal discharge with odor since yesterday. States has had unprotected intercourse but with same partner.  Pt requesting medicine to help with her anxiety. Denies SI/SA. States went to [facility] and didn't stay. States under a lot of stress and her mom passed away.

## 2023-10-21 NOTE — ED Provider Notes (Signed)
 [facility]    CSN: [id number] Arrival date & time: 10/21/23  1349      History   Chief Complaint Chief Complaint  Patient presents with   Vaginal Discharge    HPI Morgan Morales is a 33 y.o. female.   Morgan Morales is a 33 y.o. female presenting for chief complaint of Vaginal Discharge that started last night after intercourse with her female sexual partner.  Intercourse is unprotected.  She denies new sexual partners and known exposures to STDs.  Vaginal discharge is thin, white, and with fishy odor.  She is concerned that she may have an STD and would like STD testing today.  Denies urinary symptoms.  Reports some left lower quadrant pelvic pain that comes and goes and is described as cramping.  Menstrual cycles are irregular, she is unsure of when her last menstrual was.  She is unsure if she could be pregnant.  Denies recent antibiotic or steroid use, vaginal itching, constipation, diarrhea, blood/mucus in the stools, nausea, vomiting, flank pain, dizziness, headache, and fever/chills.  She has not attempted treatment of symptoms at home.   Vaginal Discharge   History reviewed. No pertinent past medical history.  Patient Active Problem List   Diagnosis Date Noted   MDD (major depressive disorder), recurrent episode, moderate (HCC) 12/21/2022   Passive suicidal ideations 12/21/2022   Tinnitus of both ears 12/21/2022   Lower urinary tract infectious disease 07/01/2017   Encounter for initial prescription of Nexplanon  09/19/2015   MENSTRUATION, PAINFUL 04/29/2006   ACNE 04/29/2006    History reviewed. No pertinent surgical history.  OB History   No obstetric history on file.      Home Medications    Prior to Admission medications   Medication Sig Start Date End Date Taking? Authorizing Provider  ibuprofen  (ADVIL ) 800 MG tablet Take 1 tablet (800 mg total) by mouth 3 (three) times daily. 10/21/23  Yes Enedelia Dorna HERO, FNP  QUEtiapine   (SEROQUEL ) 25 MG tablet Take 1 tablet (25 mg total) by mouth at bedtime. 09/30/23   Celestia Rosaline SQUIBB, NP    Family History Family History  Problem Relation Age of Onset   Hypertension Mother    Diabetes Mother     Social History Social History   Tobacco Use   Smoking status: Every Day    Current packs/day: 0.50    Types: Cigarettes   Smokeless tobacco: Never   Tobacco comments:    8 cig/day  Substance Use Topics   Alcohol use: Yes    Alcohol/week: 0.0 standard drinks of alcohol    Comment: occ   Drug use: No     Allergies   Patient has no known allergies.   Review of Systems Review of Systems  Genitourinary:  Positive for vaginal discharge.  Per HPI   Physical Exam Triage Vital Signs ED Triage Vitals  Encounter Vitals Group     BP 10/21/23 1443 (!) 147/95     Girls Systolic BP Percentile --      Girls Diastolic BP Percentile --      Boys Systolic BP Percentile --      Boys Diastolic BP Percentile --      Pulse Rate 10/21/23 1443 76     Resp 10/21/23 1443 20     Temp 10/21/23 1443 98.2 F (36.8 C)     Temp src --      SpO2 10/21/23 1443 99 %     Weight --  Height --      Head Circumference --      Peak Flow --      Pain Score 10/21/23 1444 0     Pain Loc --      Pain Education --      Exclude from Growth Chart --    No data found.  Updated Vital Signs BP (!) 147/95 (BP Location: Right Arm)   Pulse 76   Temp 98.2 F (36.8 C)   Resp 20   SpO2 99%   Visual Acuity Right Eye Distance:   Left Eye Distance:   Bilateral Distance:    Right Eye Near:   Left Eye Near:    Bilateral Near:     Physical Exam Vitals and nursing note reviewed.  Constitutional:      Appearance: She is not ill-appearing or toxic-appearing.  HENT:     Head: Normocephalic and atraumatic.     Right Ear: Hearing and external ear normal.     Left Ear: Hearing and external ear normal.     Nose: Nose normal.     Mouth/Throat:     Lips: Pink.  Eyes:     General:  Lids are normal. Vision grossly intact. Gaze aligned appropriately.     Extraocular Movements: Extraocular movements intact.     Conjunctiva/sclera: Conjunctivae normal.  Pulmonary:     Effort: Pulmonary effort is normal.  Abdominal:     General: Bowel sounds are normal.     Palpations: Abdomen is soft.     Tenderness: There is no abdominal tenderness. There is no right CVA tenderness, left CVA tenderness or guarding.  Musculoskeletal:     Cervical back: Neck supple.  Skin:    General: Skin is warm and dry.     Capillary Refill: Capillary refill takes less than 2 seconds.     Findings: No rash.  Neurological:     General: No focal deficit present.     Mental Status: She is alert and oriented to person, place, and time. Mental status is at baseline.     Cranial Nerves: No dysarthria or facial asymmetry.  Psychiatric:        Mood and Affect: Mood normal.        Speech: Speech normal.        Behavior: Behavior normal.        Thought Content: Thought content normal.        Judgment: Judgment normal.      UC Treatments / Results  Labs (all labs ordered are listed, but only abnormal results are displayed) Labs Reviewed  POCT URINE PREGNANCY  CERVICOVAGINAL ANCILLARY ONLY    EKG   Radiology No results found.  Procedures Procedures (including critical care time)  Medications Ordered in UC Medications - No data to display  Initial Impression / Assessment and Plan / UC Course  I have reviewed the triage vital signs and the nursing notes.  Pertinent labs & imaging results that were available during my care of the patient were reviewed by me and considered in my medical decision making (see chart for details).   1.  Vaginal discharge, urinary pregnancy test negative STI labs pending, will notify patient of positive results and treat accordingly per protocol when labs result.  Patient declines HIV/RPR today. Patient to avoid sexual intercourse until screening testing comes  back.   Ibuprofen  as needed for abdominal/pelvic pain. Education provided regarding safe sexual practices and patient encouraged to use protection to prevent spread of STIs.  Urine pregnancy test is negative.  Counseled patient on potential for adverse effects with medications prescribed/recommended today, strict ER and return-to-clinic precautions discussed, patient verbalized understanding.    Final Clinical Impressions(s) / UC Diagnoses   Final diagnoses:  Vaginal discharge  Urine pregnancy test negative     Discharge Instructions      Urine pregnancy is NEGATIVE.   STD testing pending, this will take 2-3 days to result. We will only call you if your testing is positive for any infection(s) and we will provide treatment.  Avoid sexual intercourse until your STD results come back.  If any of your STD results are positive, you will need to avoid sexual intercourse for 7 days while you are being treated to prevent spread of STD.  Condom use is the best way to prevent spread of STDs. Notify partner(s) of any positive results.  Return to urgent care as needed.      ED Prescriptions     Medication Sig Dispense Auth. Provider   ibuprofen  (ADVIL ) 800 MG tablet Take 1 tablet (800 mg total) by mouth 3 (three) times daily. 21 tablet Enedelia Dorna HERO, FNP      PDMP not reviewed this encounter.   Enedelia Dorna HERO, [place] 10/21/23 [phone]

## 2023-10-22 ENCOUNTER — Ambulatory Visit (HOSPITAL_COMMUNITY): Payer: Self-pay

## 2023-10-22 LAB — CERVICOVAGINAL ANCILLARY ONLY
Bacterial Vaginitis (gardnerella): NEGATIVE
Candida Glabrata: NEGATIVE
Candida Vaginitis: NEGATIVE
Chlamydia: NEGATIVE
Comment: NEGATIVE
Comment: NEGATIVE
Comment: NEGATIVE
Comment: NEGATIVE
Comment: NEGATIVE
Comment: NORMAL
Neisseria Gonorrhea: POSITIVE — AB
Trichomonas: POSITIVE — AB

## 2023-10-22 MED ORDER — DOXYCYCLINE HYCLATE 100 MG PO CAPS: 100.0000 mg | ORAL_CAPSULE | Freq: Two times a day (BID) | ORAL | 0 refills | Status: DC

## 2023-10-22 MED ORDER — METRONIDAZOLE 500 MG PO TABS: 500.0000 mg | ORAL_TABLET | Freq: Two times a day (BID) | ORAL | 0 refills | Status: AC

## 2023-11-11 ENCOUNTER — Other Ambulatory Visit (INDEPENDENT_AMBULATORY_CARE_PROVIDER_SITE_OTHER): Payer: Self-pay

## 2023-11-11 DIAGNOSIS — G4709 Other insomnia: Secondary | ICD-10-CM

## 2023-11-11 DIAGNOSIS — F4323 Adjustment disorder with mixed anxiety and depressed mood: Secondary | ICD-10-CM

## 2023-11-11 NOTE — Telephone Encounter (Signed)
 Copied from CRM [id number]. Topic: Clinical - Medication Refill >> Nov 11, 2023  9:33 AM Emylou G wrote: Medication: QUEtiapine  (SEROQUEL ) 25 MG tablet  Has the patient contacted their pharmacy? No (Agent: If no, request that the patient contact the pharmacy for the refill. If patient does not wish to contact the pharmacy document the reason why and proceed with request.) (Agent: If yes, when and what did the pharmacy advise?)  This is the patient's preferred pharmacy:  [facility] - [place], [place] - [address] [place] [address] Phone: [phone] Fax: [phone]  Is this the correct pharmacy for this prescription? No If no, delete pharmacy and type the correct one.   Has the prescription been filled recently? unsure  Is the patient out of the medication? Yes  Has the patient been seen for an appointment in the last year OR does the patient have an upcoming appointment? Yes  Can we respond through MyChart? Yes  Agent: Please be advised that Rx refills may take up to 3 business days. We ask that you follow-up with your pharmacy.

## 2023-11-12 NOTE — Telephone Encounter (Signed)
 Requested medication (s) are due for refill today: no  Requested medication (s) are on the active medication list: yes  Last refill:  09/30/23  Future visit scheduled: no  Notes to clinic:  Unable to refill per protocol, cannot delegate.      Requested Prescriptions  Pending Prescriptions Disp Refills   QUEtiapine  (SEROQUEL ) 25 MG tablet 30 tablet 0    Sig: Take 1 tablet (25 mg total) by mouth at bedtime.     Not Delegated - Psychiatry:  Antipsychotics - Second Generation (Atypical) - quetiapine  Failed - 11/12/2023  9:05 AM      Failed - This refill cannot be delegated      Failed - TSH in normal range and within 360 days    TSH  Date Value Ref Range Status  09/04/2015 0.486 0.450 - 4.500 uIU/mL Final         Failed - Completed PHQ-2 or PHQ-9 in the last 360 days      Failed - Last BP in normal range    BP Readings from Last 1 Encounters:  10/21/23 (!) 147/95         Failed - Lipid Panel in normal range within the last 12 months    No results found for: CHOL, POCCHOL, CHOLTOT No results found for: LDLCALC, LDLC, HIRISKLDL, POCLDL, LDLDIRECT, REALLDLC, TOTLDLC No results found for: HDL, POCHDL No results found for: TRIG, POCTRIG       Passed - Last Heart Rate in normal range    Pulse Readings from Last 1 Encounters:  10/21/23 76         Passed - Valid encounter within last 6 months    Recent Outpatient Visits           5 months ago Encounter to establish care   [facility] Celestia Rosaline SQUIBB, NP              Passed - CBC within normal limits and completed in the last 12 months    WBC  Date Value Ref Range Status  03/04/2023 8.4 4.0 - 10.5 K/uL Final   RBC  Date Value Ref Range Status  03/04/2023 3.89 3.87 - 5.11 MIL/uL Final   Hemoglobin  Date Value Ref Range Status  03/04/2023 12.0 12.0 - 15.0 g/dL Final  92/94/7982 84.3 11.1 - 15.9 g/dL Final   HCT  Date Value Ref Range Status   03/04/2023 36.6 36.0 - 46.0 % Final   Hematocrit  Date Value Ref Range Status  09/04/2015 47.5 (H) 34.0 - 46.6 % Final   MCHC  Date Value Ref Range Status  03/04/2023 32.8 30.0 - 36.0 g/dL Final   [facility]  Date Value Ref Range Status  03/04/2023 30.8 26.0 - 34.0 pg Final   MCV  Date Value Ref Range Status  03/04/2023 94.1 80.0 - 100.0 fL Final  09/04/2015 95 79 - 97 fL Final   No results found for: PLTCOUNTKUC, LABPLAT, POCPLA RDW  Date Value Ref Range Status  03/04/2023 12.8 11.5 - 15.5 % Final  09/04/2015 13.5 12.3 - 15.4 % Final         Passed - CMP within normal limits and completed in the last 12 months    Albumin  Date Value Ref Range Status  03/04/2023 3.2 (L) 3.5 - 5.0 g/dL Final  92/94/7982 4.3 3.5 - 5.5 g/dL Final   Alkaline Phosphatase  Date Value Ref Range Status  03/04/2023 55 38 - 126 U/L Final   ALT  Date Value Ref Range Status  03/04/2023 26 0 - 44 U/L Final   AST  Date Value Ref Range Status  03/04/2023 49 (H) 15 - 41 U/L Final   BUN  Date Value Ref Range Status  03/04/2023 6 6 - 20 mg/dL Final  92/94/7982 4 (L) 6 - 20 mg/dL Final   Calcium  Date Value Ref Range Status  03/04/2023 8.6 (L) 8.9 - 10.3 mg/dL Final   Calcium, Ion  Date Value Ref Range Status  09/22/2011 1.24 (H) 1.12 - 1.23 mmol/L Final   CO2  Date Value Ref Range Status  03/04/2023 29 22 - 32 mmol/L Final   TCO2  Date Value Ref Range Status  09/22/2011 25 0 - 100 mmol/L Final   Creatinine, Ser  Date Value Ref Range Status  03/04/2023 0.70 0.44 - 1.00 mg/dL Final   Glucose, Bld  Date Value Ref Range Status  03/04/2023 102 (H) 70 - 99 mg/dL Final    Comment:    Glucose reference range applies only to samples taken after fasting for at least 8 hours.   Potassium  Date Value Ref Range Status  03/04/2023 3.7 3.5 - 5.1 mmol/L Final   Sodium  Date Value Ref Range Status  03/04/2023 134 (L) 135 - 145 mmol/L Final  09/04/2015 139 134 - 144 mmol/L Final    Total Bilirubin  Date Value Ref Range Status  03/04/2023 0.4 0.0 - 1.2 mg/dL Final   Bilirubin Total  Date Value Ref Range Status  09/04/2015 0.3 0.0 - 1.2 mg/dL Final   Protein, ur  Date Value Ref Range Status  03/04/2023 30 (A) NEGATIVE mg/dL Final   Total Protein  Date Value Ref Range Status  03/04/2023 7.6 6.5 - 8.1 g/dL Final  92/94/7982 6.9 6.0 - 8.5 g/dL Final   GFR calc Af Amer  Date Value Ref Range Status  09/04/2015 110 >59 mL/min/1.73 Final   GFR, Estimated  Date Value Ref Range Status  03/04/2023 >60 >60 mL/min Final    Comment:    (NOTE) Calculated using the CKD-EPI Creatinine Equation (2021)

## 2023-11-16 MED ORDER — QUETIAPINE FUMARATE 25 MG PO TABS: 25.0000 mg | ORAL_TABLET | Freq: Every day | ORAL | 0 refills | Status: DC

## 2023-12-22 ENCOUNTER — Other Ambulatory Visit (INDEPENDENT_AMBULATORY_CARE_PROVIDER_SITE_OTHER): Payer: Self-pay | Admitting: Primary Care

## 2023-12-22 DIAGNOSIS — G4709 Other insomnia: Secondary | ICD-10-CM

## 2023-12-22 DIAGNOSIS — F4323 Adjustment disorder with mixed anxiety and depressed mood: Secondary | ICD-10-CM

## 2023-12-22 NOTE — Telephone Encounter (Unsigned)
 Copied from CRM #[id number]. Topic: Clinical - Medication Refill >> Dec 22, 2023  3:34 PM Zebedee SAUNDERS wrote: Medication: QUEtiapine  (SEROQUEL ) 25 MG tablet  Has the patient contacted their pharmacy? Yes (Agent: If no, request that the patient contact the pharmacy for the refill. If patient does not wish to contact the pharmacy document the reason why and proceed with request.) (Agent: If yes, when and what did the pharmacy advise?)  This is the patient's preferred pharmacy:  [facility] - [place], [place] - [address] [place] [address] Phone: [phone] Fax: [phone]  Is this the correct pharmacy for this prescription? Yes If no, delete pharmacy and type the correct one.   Has the prescription been filled recently? Yes  Is the patient out of the medication? Yes  Has the patient been seen for an appointment in the last year OR does the patient have an upcoming appointment? Yes  Can we respond through MyChart? Yes  Agent: Please be advised that Rx refills may take up to 3 business days. We ask that you follow-up with your pharmacy.

## 2023-12-24 NOTE — Telephone Encounter (Signed)
 Requested medication (s) are due for refill today: yes  Requested medication (s) are on the active medication list: yes  Last refill:  11/16/23  Future visit scheduled: yes  Notes to clinic:  Unable to refill per protocol, cannot delegate.      Requested Prescriptions  Pending Prescriptions Disp Refills   QUEtiapine  (SEROQUEL ) 25 MG tablet 30 tablet 0    Sig: Take 1 tablet (25 mg total) by mouth at bedtime.     Not Delegated - Psychiatry:  Antipsychotics - Second Generation (Atypical) - quetiapine  Failed - 12/24/2023 11:50 AM      Failed - This refill cannot be delegated      Failed - TSH in normal range and within 360 days    TSH  Date Value Ref Range Status  09/04/2015 0.486 0.450 - 4.500 uIU/mL Final         Failed - Completed PHQ-2 or PHQ-9 in the last 360 days      Failed - Last BP in normal range    BP Readings from Last 1 Encounters:  10/21/23 (!) 147/95         Failed - Valid encounter within last 6 months    Recent Outpatient Visits           6 months ago Encounter to establish care   [place] [facility] Celestia Rosaline SQUIBB, NP              Failed - Lipid Panel in normal range within the last 12 months    No results found for: CHOL, POCCHOL, CHOLTOT No results found for: LDLCALC, LDLC, HIRISKLDL, POCLDL, LDLDIRECT, REALLDLC, TOTLDLC No results found for: HDL, POCHDL No results found for: TRIG, POCTRIG       Passed - Last Heart Rate in normal range    Pulse Readings from Last 1 Encounters:  10/21/23 76         Passed - CBC within normal limits and completed in the last 12 months    WBC  Date Value Ref Range Status  03/04/2023 8.4 4.0 - 10.5 K/uL Final   RBC  Date Value Ref Range Status  03/04/2023 3.89 3.87 - 5.11 MIL/uL Final   Hemoglobin  Date Value Ref Range Status  03/04/2023 12.0 12.0 - 15.0 g/dL Final  92/94/7982 84.3 11.1 - 15.9 g/dL Final   HCT  Date Value Ref Range Status   03/04/2023 36.6 36.0 - 46.0 % Final   Hematocrit  Date Value Ref Range Status  09/04/2015 47.5 (H) 34.0 - 46.6 % Final   MCHC  Date Value Ref Range Status  03/04/2023 32.8 30.0 - 36.0 g/dL Final   [facility]  Date Value Ref Range Status  03/04/2023 30.8 26.0 - 34.0 pg Final   MCV  Date Value Ref Range Status  03/04/2023 94.1 80.0 - 100.0 fL Final  09/04/2015 95 79 - 97 fL Final   No results found for: PLTCOUNTKUC, LABPLAT, POCPLA RDW  Date Value Ref Range Status  03/04/2023 12.8 11.5 - 15.5 % Final  09/04/2015 13.5 12.3 - 15.4 % Final         Passed - CMP within normal limits and completed in the last 12 months    Albumin  Date Value Ref Range Status  03/04/2023 3.2 (L) 3.5 - 5.0 g/dL Final  92/94/7982 4.3 3.5 - 5.5 g/dL Final   Alkaline Phosphatase  Date Value Ref Range Status  03/04/2023 55 38 - 126 U/L Final   ALT  Date Value Ref Range Status  03/04/2023 26 0 - 44 U/L Final   AST  Date Value Ref Range Status  03/04/2023 49 (H) 15 - 41 U/L Final   BUN  Date Value Ref Range Status  03/04/2023 6 6 - 20 mg/dL Final  92/94/7982 4 (L) 6 - 20 mg/dL Final   Calcium  Date Value Ref Range Status  03/04/2023 8.6 (L) 8.9 - 10.3 mg/dL Final   Calcium, Ion  Date Value Ref Range Status  09/22/2011 1.24 (H) 1.12 - 1.23 mmol/L Final   CO2  Date Value Ref Range Status  03/04/2023 29 22 - 32 mmol/L Final   TCO2  Date Value Ref Range Status  09/22/2011 25 0 - 100 mmol/L Final   Creatinine, Ser  Date Value Ref Range Status  03/04/2023 0.70 0.44 - 1.00 mg/dL Final   Glucose, Bld  Date Value Ref Range Status  03/04/2023 102 (H) 70 - 99 mg/dL Final    Comment:    Glucose reference range applies only to samples taken after fasting for at least 8 hours.   Potassium  Date Value Ref Range Status  03/04/2023 3.7 3.5 - 5.1 mmol/L Final   Sodium  Date Value Ref Range Status  03/04/2023 134 (L) 135 - 145 mmol/L Final  09/04/2015 139 134 - 144 mmol/L Final    Total Bilirubin  Date Value Ref Range Status  03/04/2023 0.4 0.0 - 1.2 mg/dL Final   Bilirubin Total  Date Value Ref Range Status  09/04/2015 0.3 0.0 - 1.2 mg/dL Final   Protein, ur  Date Value Ref Range Status  03/04/2023 30 (A) NEGATIVE mg/dL Final   Total Protein  Date Value Ref Range Status  03/04/2023 7.6 6.5 - 8.1 g/dL Final  92/94/7982 6.9 6.0 - 8.5 g/dL Final   GFR calc Af Amer  Date Value Ref Range Status  09/04/2015 110 >59 mL/min/1.73 Final   GFR, Estimated  Date Value Ref Range Status  03/04/2023 >60 >60 mL/min Final    Comment:    (NOTE) Calculated using the CKD-EPI Creatinine Equation (2021)

## 2024-01-03 ENCOUNTER — Encounter: Payer: Self-pay | Admitting: Radiology

## 2024-01-13 ENCOUNTER — Encounter (INDEPENDENT_AMBULATORY_CARE_PROVIDER_SITE_OTHER): Admitting: Primary Care

## 2024-01-19 ENCOUNTER — Other Ambulatory Visit (INDEPENDENT_AMBULATORY_CARE_PROVIDER_SITE_OTHER): Payer: Self-pay | Admitting: Primary Care

## 2024-01-19 DIAGNOSIS — G4709 Other insomnia: Secondary | ICD-10-CM

## 2024-01-19 DIAGNOSIS — F4323 Adjustment disorder with mixed anxiety and depressed mood: Secondary | ICD-10-CM

## 2024-01-21 NOTE — Telephone Encounter (Signed)
 Will forward to provider

## 2024-02-03 ENCOUNTER — Other Ambulatory Visit (INDEPENDENT_AMBULATORY_CARE_PROVIDER_SITE_OTHER): Payer: Self-pay | Admitting: Primary Care

## 2024-02-03 DIAGNOSIS — F4323 Adjustment disorder with mixed anxiety and depressed mood: Secondary | ICD-10-CM

## 2024-02-03 DIAGNOSIS — G4709 Other insomnia: Secondary | ICD-10-CM

## 2024-03-03 ENCOUNTER — Telehealth: Payer: Self-pay | Admitting: *Deleted

## 2024-03-03 NOTE — Telephone Encounter (Signed)
 Copied from CRM #[id number]. Topic: Clinical - Prescription Issue >> Mar 01, 2024  9:49 AM Morgan Morales ORN wrote: Reason for CRM: missed her last appointment and her PCP would not fill her medication QUEtiapine  (SEROQUEL ) 25 MG tablet. Please message back the patient via MyChart messaging

## 2024-03-09 ENCOUNTER — Other Ambulatory Visit (INDEPENDENT_AMBULATORY_CARE_PROVIDER_SITE_OTHER): Payer: Self-pay | Admitting: Primary Care

## 2024-03-09 DIAGNOSIS — G4709 Other insomnia: Secondary | ICD-10-CM

## 2024-03-09 DIAGNOSIS — F4323 Adjustment disorder with mixed anxiety and depressed mood: Secondary | ICD-10-CM

## 2024-03-09 MED ORDER — QUETIAPINE FUMARATE 25 MG PO TABS: 25.0000 mg | ORAL_TABLET | Freq: Every day | ORAL | 0 refills | Status: AC

## 2024-04-13 ENCOUNTER — Inpatient Hospital Stay (HOSPITAL_COMMUNITY): Admit: 2024-04-13

## 2024-05-03 ENCOUNTER — Encounter (INDEPENDENT_AMBULATORY_CARE_PROVIDER_SITE_OTHER): Admitting: Primary Care
